# Patient Record
Sex: Female | Born: 1950 | Hispanic: No | Marital: Married | Smoking: Never smoker
Health system: Southern US, Community
[De-identification: ages and names within clinical notes are randomized; demographics above are authoritative.]

## PROBLEM LIST (undated history)

## (undated) DIAGNOSIS — M8580 Other specified disorders of bone density and structure, unspecified site: Secondary | ICD-10-CM

## (undated) DIAGNOSIS — R011 Cardiac murmur, unspecified: Secondary | ICD-10-CM

## (undated) DIAGNOSIS — Z5189 Encounter for other specified aftercare: Secondary | ICD-10-CM

## (undated) DIAGNOSIS — I10 Essential (primary) hypertension: Secondary | ICD-10-CM

## (undated) DIAGNOSIS — M87051 Idiopathic aseptic necrosis of right femur: Secondary | ICD-10-CM

## (undated) DIAGNOSIS — M51369 Other intervertebral disc degeneration, lumbar region without mention of lumbar back pain or lower extremity pain: Secondary | ICD-10-CM

## (undated) DIAGNOSIS — M719 Bursopathy, unspecified: Secondary | ICD-10-CM

## (undated) DIAGNOSIS — M4800 Spinal stenosis, site unspecified: Secondary | ICD-10-CM

## (undated) DIAGNOSIS — C801 Malignant (primary) neoplasm, unspecified: Secondary | ICD-10-CM

## (undated) DIAGNOSIS — K219 Gastro-esophageal reflux disease without esophagitis: Secondary | ICD-10-CM

## (undated) DIAGNOSIS — T8859XA Other complications of anesthesia, initial encounter: Secondary | ICD-10-CM

## (undated) DIAGNOSIS — E7800 Pure hypercholesterolemia, unspecified: Secondary | ICD-10-CM

## (undated) DIAGNOSIS — C8290 Follicular lymphoma, unspecified, unspecified site: Secondary | ICD-10-CM

## (undated) DIAGNOSIS — K5790 Diverticulosis of intestine, part unspecified, without perforation or abscess without bleeding: Secondary | ICD-10-CM

## (undated) DIAGNOSIS — K589 Irritable bowel syndrome without diarrhea: Secondary | ICD-10-CM

## (undated) DIAGNOSIS — K635 Polyp of colon: Secondary | ICD-10-CM

## (undated) DIAGNOSIS — C4491 Basal cell carcinoma of skin, unspecified: Secondary | ICD-10-CM

## (undated) HISTORY — PX: ABDOMINAL HYSTERECTOMY: SHX81

## (undated) HISTORY — PX: FL INJ RIGHT KNEE MR ARTHROGRAM (ARMC HX): HXRAD1302

## (undated) HISTORY — PX: SPINE SURGERY: SHX786

## (undated) HISTORY — PX: BREAST SURGERY: SHX581

## (undated) HISTORY — PX: TUBAL LIGATION: SHX77

## (undated) HISTORY — PX: LYMPH GLAND EXCISION: SHX13

## (undated) HISTORY — PX: BACK SURGERY: SHX140

## (undated) HISTORY — PX: KNEE ARTHROSCOPY: SHX127

## (undated) HISTORY — PX: OTHER SURGICAL HISTORY: SHX169

## (undated) HISTORY — PX: FIBULA FRACTURE SURGERY: SHX947

## (undated) HISTORY — PX: CARDIAC CATHETERIZATION: SHX172

## (undated) HISTORY — PX: CHOLECYSTECTOMY: SHX55

---

## 2008-02-23 ENCOUNTER — Ambulatory Visit: Payer: Self-pay | Admitting: Unknown Physician Specialty

## 2010-10-29 ENCOUNTER — Ambulatory Visit: Payer: Self-pay | Admitting: Rheumatology

## 2010-12-03 ENCOUNTER — Ambulatory Visit: Payer: Self-pay | Admitting: Anesthesiology

## 2010-12-11 ENCOUNTER — Ambulatory Visit: Payer: Self-pay | Admitting: General Practice

## 2011-06-28 ENCOUNTER — Other Ambulatory Visit: Payer: Self-pay | Admitting: Unknown Physician Specialty

## 2011-06-28 LAB — CLOSTRIDIUM DIFFICILE BY PCR

## 2011-07-22 ENCOUNTER — Ambulatory Visit: Payer: Self-pay | Admitting: Internal Medicine

## 2011-07-28 ENCOUNTER — Ambulatory Visit: Payer: Self-pay | Admitting: Unknown Physician Specialty

## 2011-08-02 LAB — PATHOLOGY REPORT

## 2011-12-23 DIAGNOSIS — Z853 Personal history of malignant neoplasm of breast: Secondary | ICD-10-CM | POA: Insufficient documentation

## 2012-06-12 DIAGNOSIS — R351 Nocturia: Secondary | ICD-10-CM | POA: Insufficient documentation

## 2012-06-12 DIAGNOSIS — R350 Frequency of micturition: Secondary | ICD-10-CM | POA: Insufficient documentation

## 2012-07-04 ENCOUNTER — Emergency Department: Payer: Self-pay | Admitting: Emergency Medicine

## 2013-06-03 IMAGING — CT CT ABD-PELV W/O CM
1 of 2 series · 15 of 32 positions shown, 19 images · non-contrast
Comparison: none

REASON FOR EXAM: hematuria renal stone protocol
COMMENTS:

PROCEDURE:     KCT - KCT ABDOMEN/PELVIS WO  - July 22, 2011  [DATE]
RESULT:
TECHNIQUE: Helical noncontrasted 5 mm sections were obtained from the lung
bases through the pubic symphysis.

[Series 2: abd wo 5.0 i40f 3 · axial · 0.94mm/px · z∈[-290,+150]mm · 15 of 96 slices shown, 19 images]
[im 4/96  soft-tissue]
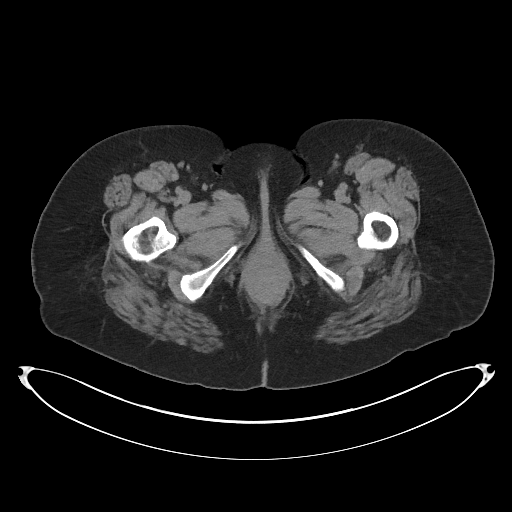
[im 4/96  bone]
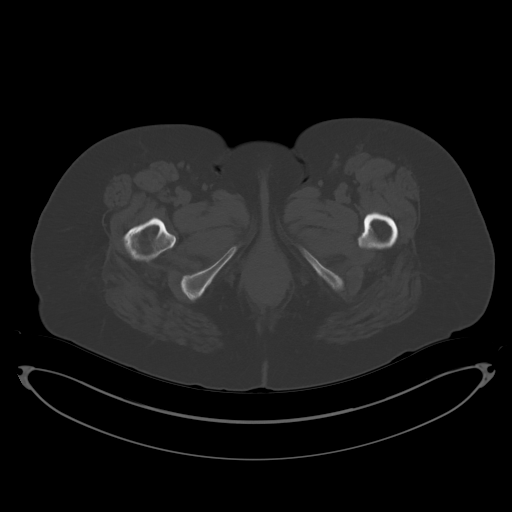
[im 12/96  soft-tissue]
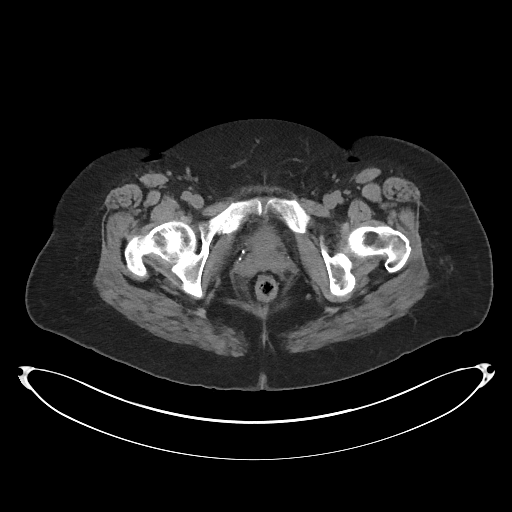
[im 20/96  soft-tissue]
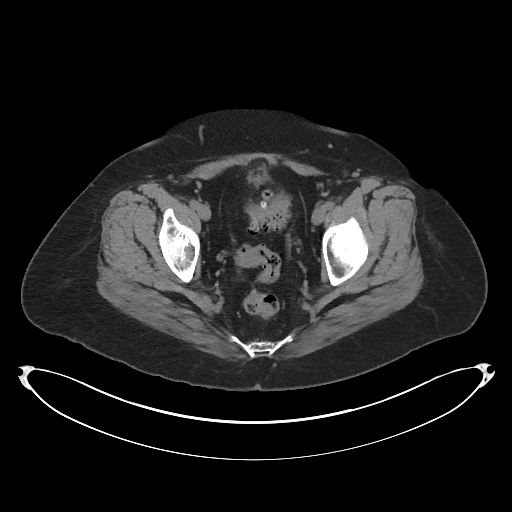
[im 27/96  soft-tissue]
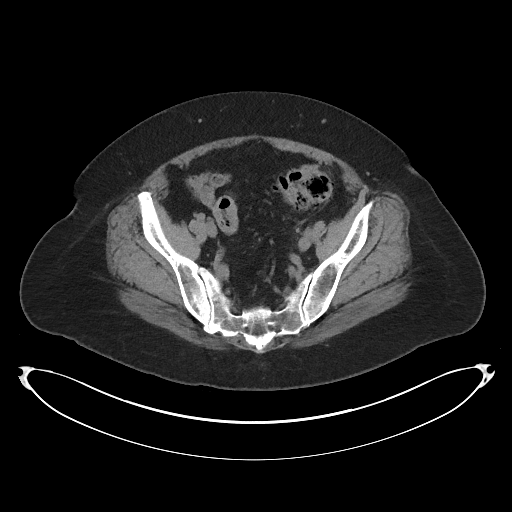
[im 35/96  soft-tissue]
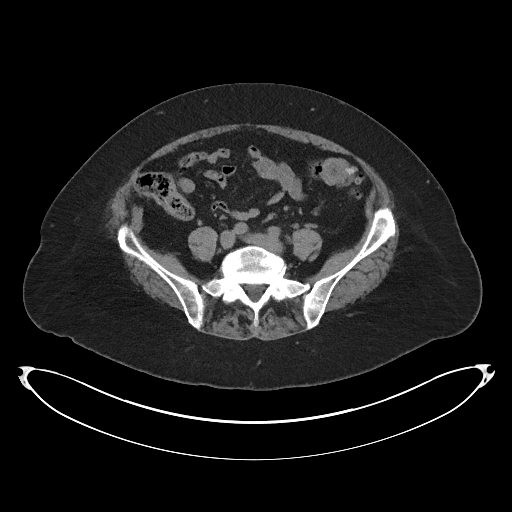
[im 42/96  soft-tissue]
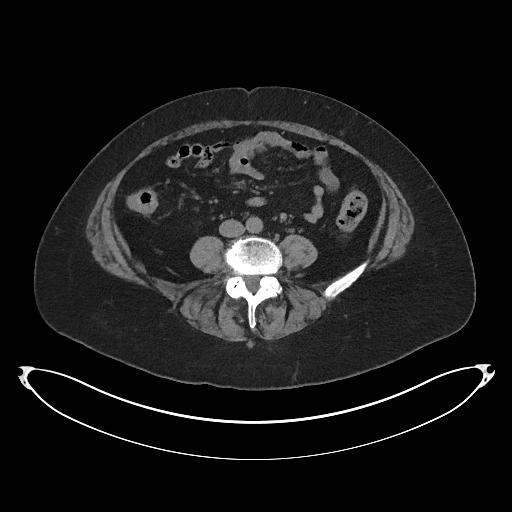
[im 50/96  soft-tissue]
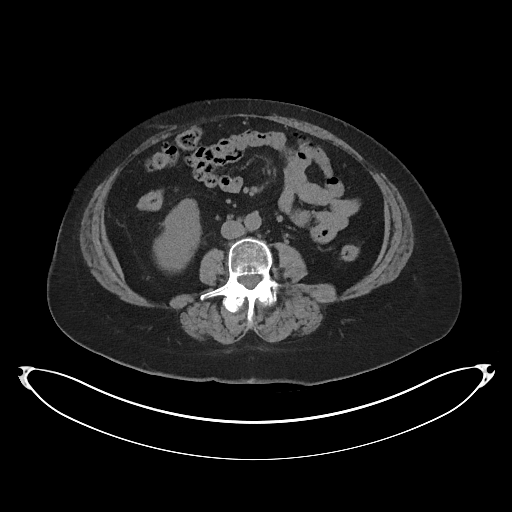
[im 54/96  soft-tissue]
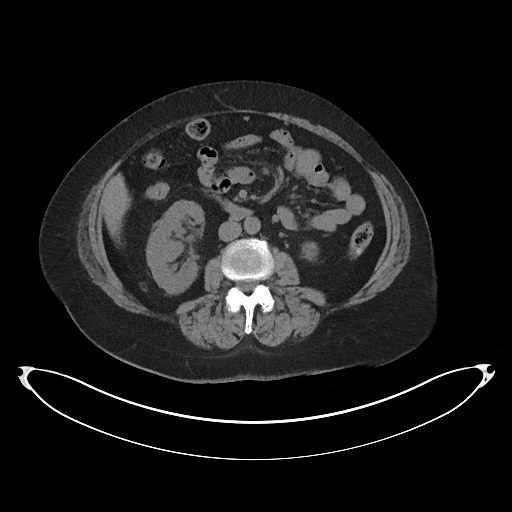
[im 61/96  soft-tissue]
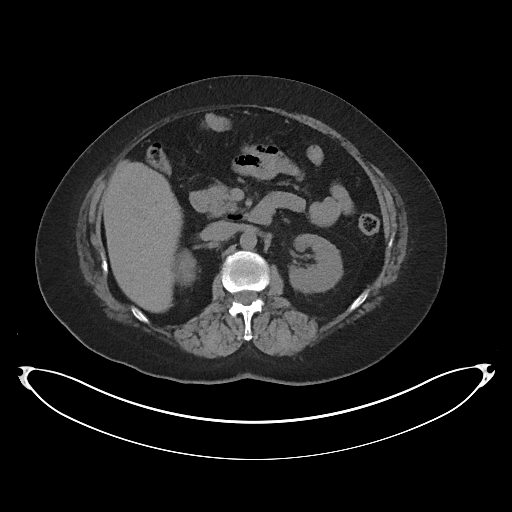
[im 61/96  bone]
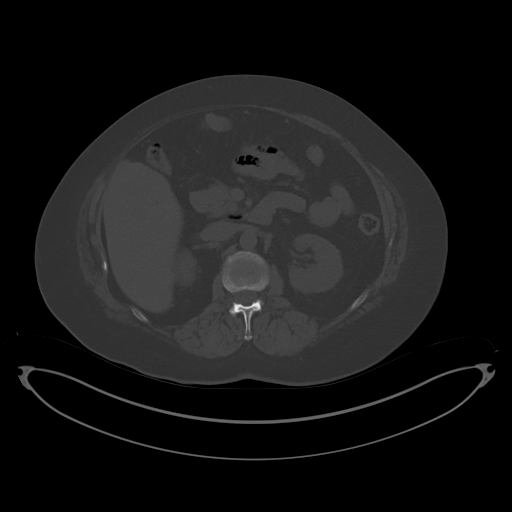
[im 69/96  soft-tissue]
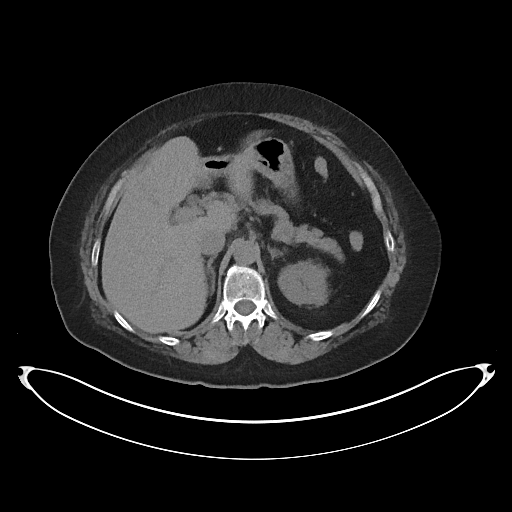
[im 77/96  soft-tissue]
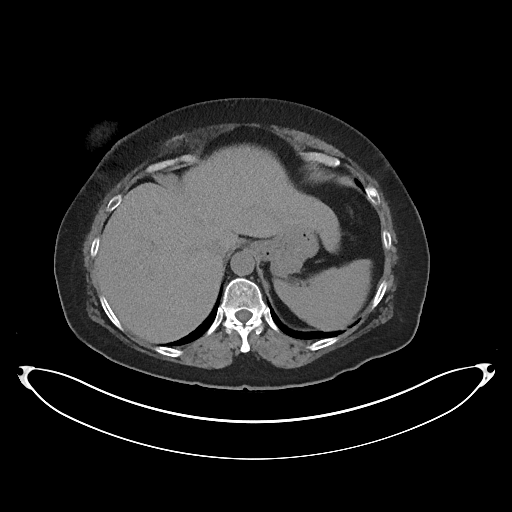
[im 80/96  lung]
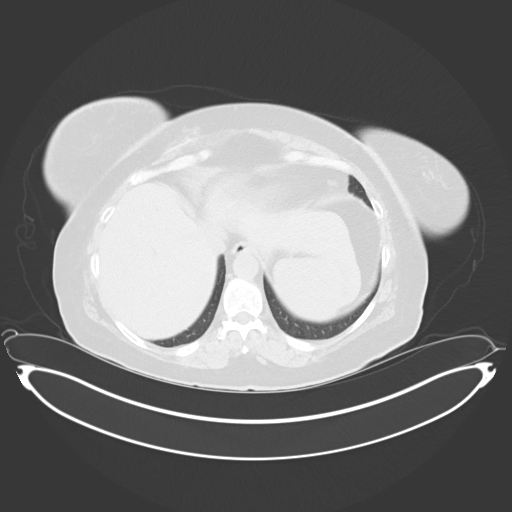
[im 84/96  soft-tissue]
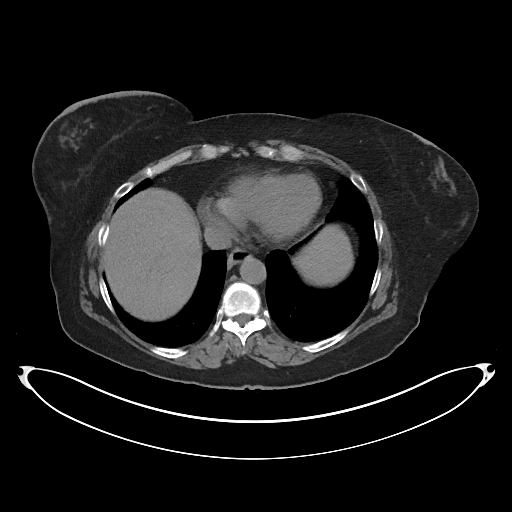
[im 84/96  lung]
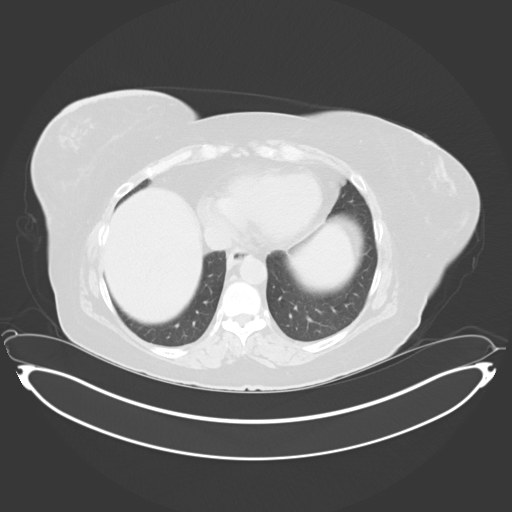
[im 88/96  lung]
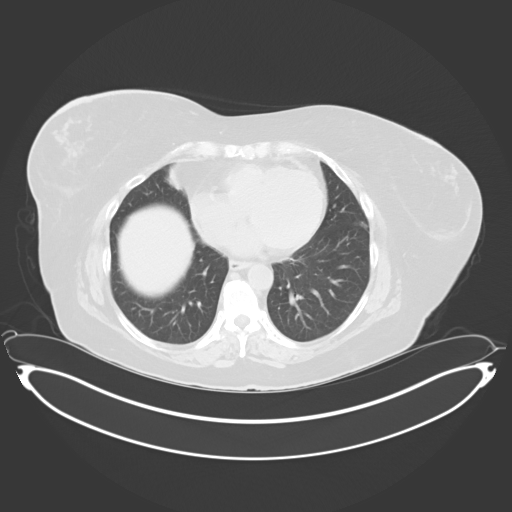
[im 92/96  soft-tissue]
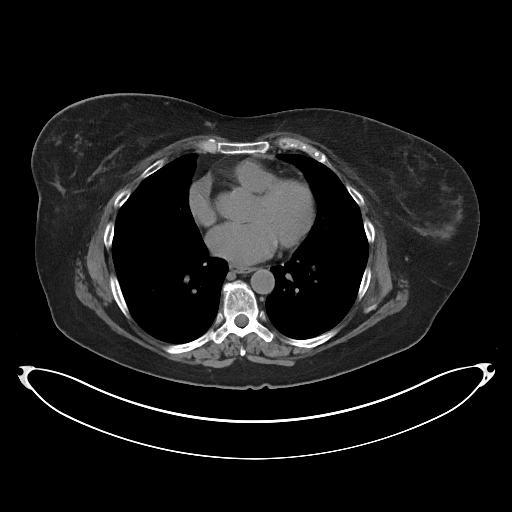
[im 92/96  lung]
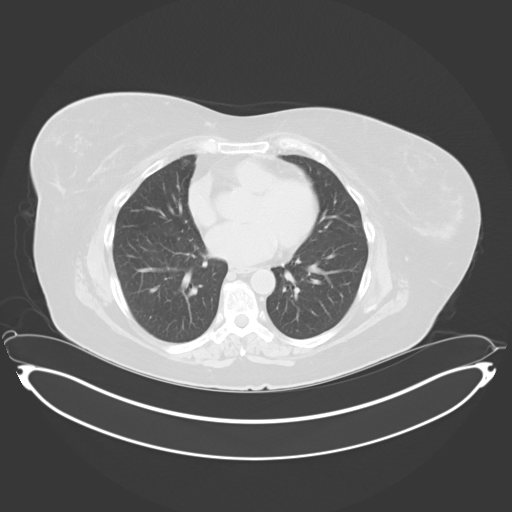

[15 of 32 positions shown; findings below may reference images not displayed]

FINDINGS: The lung bases are unremarkable.

Noncontrast evaluation of the liver, spleen, adrenals, and pancreas is
unremarkable. There is no CT evidence of hydronephrosis, hydroureter,
nephrolithiasis nor ureterolithiasis. There is no CT evidence of abdominal
or pelvic masses, free fluid, loculated fluid collections nor adenopathy
within the limitations of a noncontrasted CT. There is diverticulosis within
the sigmoid colon. There is no evidence of an abdominal aortic aneurysm.
There is no CT evidence of bowel obstruction, enteritis, colitis, nor
diverticulitis within the limitations of a noncontrasted CT.
IMPRESSION: No CT evidence of obstructive or inflammatory abnormalities.

## 2013-11-21 DIAGNOSIS — E559 Vitamin D deficiency, unspecified: Secondary | ICD-10-CM | POA: Insufficient documentation

## 2014-02-28 ENCOUNTER — Ambulatory Visit: Payer: Self-pay | Admitting: Rheumatology

## 2014-04-04 DIAGNOSIS — M5416 Radiculopathy, lumbar region: Secondary | ICD-10-CM | POA: Insufficient documentation

## 2014-04-04 DIAGNOSIS — M48062 Spinal stenosis, lumbar region with neurogenic claudication: Secondary | ICD-10-CM | POA: Insufficient documentation

## 2014-05-09 DIAGNOSIS — M7062 Trochanteric bursitis, left hip: Secondary | ICD-10-CM | POA: Insufficient documentation

## 2015-11-04 ENCOUNTER — Encounter: Payer: Self-pay | Admitting: Emergency Medicine

## 2015-11-04 ENCOUNTER — Emergency Department
Admission: EM | Admit: 2015-11-04 | Discharge: 2015-11-04 | Disposition: A | Discharge status: Other Acute Inpatient Hospital | Payer: BLUE CROSS/BLUE SHIELD | Attending: Emergency Medicine | Admitting: Emergency Medicine

## 2015-11-04 DIAGNOSIS — E876 Hypokalemia: Secondary | ICD-10-CM

## 2015-11-04 DIAGNOSIS — E871 Hypo-osmolality and hyponatremia: Secondary | ICD-10-CM

## 2015-11-04 DIAGNOSIS — I10 Essential (primary) hypertension: Secondary | ICD-10-CM | POA: Insufficient documentation

## 2015-11-04 DIAGNOSIS — K625 Hemorrhage of anus and rectum: Secondary | ICD-10-CM | POA: Diagnosis present

## 2015-11-04 DIAGNOSIS — K922 Gastrointestinal hemorrhage, unspecified: Secondary | ICD-10-CM

## 2015-11-04 HISTORY — DX: Bursopathy, unspecified: M71.9

## 2015-11-04 HISTORY — DX: Pure hypercholesterolemia, unspecified: E78.00

## 2015-11-04 HISTORY — DX: Essential (primary) hypertension: I10

## 2015-11-04 HISTORY — DX: Irritable bowel syndrome, unspecified: K58.9

## 2015-11-04 HISTORY — DX: Spinal stenosis, site unspecified: M48.00

## 2015-11-04 LAB — COMPREHENSIVE METABOLIC PANEL
ALT: 22 U/L (ref 14–54)
AST: 77 U/L — AB (ref 15–41)
Albumin: 4.6 g/dL (ref 3.5–5.0)
Alkaline Phosphatase: 61 U/L (ref 38–126)
Anion gap: 9 (ref 5–15)
BUN: 13 mg/dL (ref 6–20)
CHLORIDE: 89 mmol/L — AB (ref 101–111)
CO2: 32 mmol/L (ref 22–32)
CREATININE: 0.78 mg/dL (ref 0.44–1.00)
Calcium: 9.8 mg/dL (ref 8.9–10.3)
GFR calc non Af Amer: >60 mL/min (ref >60)
Glucose, Bld: 112 mg/dL — ABNORMAL HIGH (ref 65–99)
POTASSIUM: 2.6 mmol/L — AB (ref 3.5–5.1)
SODIUM: 130 mmol/L — AB (ref 135–145)
Total Bilirubin: 0.7 mg/dL (ref 0.3–1.2)
Total Protein: 7.5 g/dL (ref 6.5–8.1)

## 2015-11-04 LAB — CBC
HEMATOCRIT: 34.9 % — AB (ref 35.0–47.0)
Hemoglobin: 12.5 g/dL (ref 12.0–16.0)
MCH: 31 pg (ref 26.0–34.0)
MCHC: 35.9 g/dL (ref 32.0–36.0)
MCV: 86.3 fL (ref 80.0–100.0)
PLATELETS: 295 10*3/uL (ref 150–440)
RBC: 4.05 MIL/uL (ref 3.80–5.20)
RDW: 12.6 % (ref 11.5–14.5)
WBC: 10.8 10*3/uL (ref 3.6–11.0)

## 2015-11-04 LAB — TYPE AND SCREEN
ABO/RH(D): O POS
Antibody Screen: NEGATIVE

## 2015-11-04 MED ORDER — SODIUM CHLORIDE 0.9 % IV BOLUS (SEPSIS)
1000.0000 mL | Freq: Once | INTRAVENOUS | Status: AC
Start: 2015-11-04 — End: 2015-11-04
  Administered 2015-11-04: 1000 mL via INTRAVENOUS

## 2015-11-04 MED ORDER — POTASSIUM CHLORIDE 10 MEQ/100ML IV SOLN
10.0000 meq | INTRAVENOUS | Status: AC
  Administered 2015-11-04 (×2): 10 meq via INTRAVENOUS
  Filled 2015-11-04 (×2): qty 100

## 2015-11-04 NOTE — ED Provider Notes (Signed)
[facility] Emergency Department Provider Note   ____________________________________________  Time seen: Approximately 2:28 AM  I have reviewed the triage vital signs and the nursing notes.   HISTORY  Chief Complaint Rectal Bleeding    HPI Veronica Cisneros is a 65 y.o. female who comes into the hospital today with rectal bleeding. The patient had a colonoscopy done this afternoon and reports now that she's having bright red blood from her rectum. She reports that the colonoscopy was around 1:30 this afternoon. She took a nap and then had dinner. She started having some bleeding around 11 PM. She's had 6-7 episodes of bright blood. She had a tiny polyp removed today during her colonoscopy. The procedure was done at [facility]. She reports that she was told if she had more than 2 tablespoons of blood to going to the nearest emergency department. The patient has some mild left lower quadrant tenderness and she says she feels exhausted but has no significant lightheadedness or dizziness. She reports that she did have some wine this evening but nothing else. The patient is here for evaluation.The patient rates her abdominal tenderness a 2 out of 10 in intensity.   Past Medical History  Diagnosis Date  . Bursitis   . Hypertension   . Spinal stenosis   . IBS (irritable bowel syndrome)   . High cholesterol     There are no active problems to display for this patient.   Past Surgical History  Procedure Laterality Date  . Colonscopy    . Abdominal hysterectomy    . Spine surgery    . Breast surgery    . Fl inj right knee mr arthrogram ([facility] hx)      No current outpatient prescriptions on file.  Allergies Valium  No family history on file.  Social History Social History  Substance Use Topics  . Smoking status: Never Smoker   . Smokeless tobacco: Never Used  . Alcohol Use: Yes    Review of Systems Constitutional: No fever/chills Eyes: No visual  changes. ENT: No sore throat. Cardiovascular: Denies chest pain. Respiratory: Denies shortness of breath. Gastrointestinal: Mild abdominal pain and rectal bleeding Genitourinary: Negative for dysuria. Musculoskeletal: Negative for back pain. Skin: Negative for rash. Neurological: Negative for headaches, focal weakness or numbness.  10-point ROS otherwise negative.  ____________________________________________   PHYSICAL EXAM:  VITAL SIGNS: ED Triage Vitals  Enc Vitals Group     BP 11/04/15 0046 192/86 mmHg     Pulse Rate 11/04/15 0046 112     Resp 11/04/15 0046 16     Temp 11/04/15 0046 98.4 F (36.9 C)     Temp src --      SpO2 11/04/15 0046 100 %     Weight 11/04/15 0046 177 lb (80.287 kg)     Height 11/04/15 0046 5\' 4"  (1.626 m)     Head Cir --      Peak Flow --      Pain Score 11/04/15 0046 2     Pain Loc --      Pain Edu? --      Excl. in [place]? --     Constitutional: Alert and oriented. Well appearing and in Mild distress. Eyes: Conjunctivae are normal. PERRL. EOMI. Head: Atraumatic. Nose: No congestion/rhinnorhea. Mouth/Throat: Mucous membranes are moist.  Oropharynx non-erythematous. Cardiovascular: Normal rate, regular rhythm. Grossly normal heart sounds.  Good peripheral circulation. Respiratory: Normal respiratory effort.  No retractions. Lungs CTAB. Gastrointestinal: Soft with mild left lower  quadrant tenderness to palpation. No distention. Positive bowel sounds Musculoskeletal: No lower extremity tenderness nor edema.   Neurologic:  Normal speech and language.  Skin:  Skin is warm, dry and intact.  Psychiatric: Mood and affect are normal.   ____________________________________________   LABS (all labs ordered are listed, but only abnormal results are displayed)  Labs Reviewed  COMPREHENSIVE METABOLIC PANEL - Abnormal; Notable for the following:    Sodium 130 (*)    Potassium 2.6 (*)    Chloride 89 (*)    Glucose, Bld 112 (*)    AST 77 (*)    All  other components within normal limits  CBC - Abnormal; Notable for the following:    HCT 34.9 (*)    All other components within normal limits  POC OCCULT BLOOD, ED  TYPE AND SCREEN   ____________________________________________  EKG  None ____________________________________________  RADIOLOGY  None ____________________________________________   PROCEDURES  Procedure(s) performed: None  Procedures  Critical Care performed: No  ____________________________________________   INITIAL IMPRESSION / ASSESSMENT AND PLAN / ED COURSE  Pertinent labs & imaging results that were available during my care of the patient were reviewed by me and considered in my medical decision making (see chart for details).  This is a 65 year old female who comes into the hospital today with bright red bleeding per rectum after having a colonoscopy done at [facility]. The patient's blood work at this time show some hyponatremia and hypokalemia but her H&H is stable. I will contact [facility] to have the patient transferred back as her care was done there and initially. The patient receive a liter of normal saline as well as 2 doses of KCl.  I discussed the case with Dr. Valetta Mole from [facility] and the patient was accepted over to [facility]. ____________________________________________   FINAL CLINICAL IMPRESSION(S) / ED DIAGNOSES  Final diagnoses:  Lower GI bleeding  Hyponatremia  Hypokalemia      NEW MEDICATIONS STARTED DURING THIS VISIT:  New Prescriptions   No medications on file     Note:  This document was prepared using Dragon voice recognition software and may include unintentional dictation errors.    Loney Hering, MD 11/04/15 [phone]

## 2015-11-04 NOTE — ED Notes (Signed)
Transferred to [place].

## 2015-11-04 NOTE — ED Notes (Signed)
EDP notified of critical potassium.  

## 2015-11-04 NOTE — ED Notes (Addendum)
Pt states had colonscopy this afternoon. Pt states has rectal bleeding, bright red,comparitively/ approx one half tampon saturation that began around 2330. Pt denies dizziness, lightheadedness. Skin normal color warm and dry. Pt denies nausea or fever.

## 2015-11-14 DIAGNOSIS — E538 Deficiency of other specified B group vitamins: Secondary | ICD-10-CM | POA: Insufficient documentation

## 2015-11-14 DIAGNOSIS — M47816 Spondylosis without myelopathy or radiculopathy, lumbar region: Secondary | ICD-10-CM | POA: Insufficient documentation

## 2015-11-14 DIAGNOSIS — K633 Ulcer of intestine: Secondary | ICD-10-CM | POA: Insufficient documentation

## 2016-02-17 ENCOUNTER — Ambulatory Visit (INDEPENDENT_AMBULATORY_CARE_PROVIDER_SITE_OTHER): Payer: BLUE CROSS/BLUE SHIELD | Admitting: Vascular Surgery

## 2016-02-17 ENCOUNTER — Encounter (INDEPENDENT_AMBULATORY_CARE_PROVIDER_SITE_OTHER): Payer: Self-pay | Admitting: Vascular Surgery

## 2016-02-17 ENCOUNTER — Encounter (INDEPENDENT_AMBULATORY_CARE_PROVIDER_SITE_OTHER): Payer: Self-pay

## 2016-02-17 VITALS — BP 146/85 | HR 92 | Resp 17 | Ht 64.0 in | Wt 188.0 lb

## 2016-02-17 DIAGNOSIS — M79604 Pain in right leg: Secondary | ICD-10-CM

## 2016-02-17 DIAGNOSIS — M7989 Other specified soft tissue disorders: Secondary | ICD-10-CM

## 2016-02-17 DIAGNOSIS — E785 Hyperlipidemia, unspecified: Secondary | ICD-10-CM

## 2016-02-17 DIAGNOSIS — I10 Essential (primary) hypertension: Secondary | ICD-10-CM

## 2016-02-17 DIAGNOSIS — M79605 Pain in left leg: Secondary | ICD-10-CM

## 2016-02-18 DIAGNOSIS — E785 Hyperlipidemia, unspecified: Secondary | ICD-10-CM | POA: Insufficient documentation

## 2016-02-18 DIAGNOSIS — M79609 Pain in unspecified limb: Secondary | ICD-10-CM | POA: Insufficient documentation

## 2016-02-18 DIAGNOSIS — M7989 Other specified soft tissue disorders: Secondary | ICD-10-CM | POA: Insufficient documentation

## 2016-02-18 DIAGNOSIS — I10 Essential (primary) hypertension: Secondary | ICD-10-CM | POA: Insufficient documentation

## 2016-02-18 NOTE — Assessment & Plan Note (Signed)
The patient has lower extremity symptoms that sound worrisome for peripheral arterial disease. The can certainly be related to her spinal stenosis and not arterial insufficiency, but I would recommend noninvasive studies for further evaluation. We discussed the differences between peripheral arterial disease and neurogenic claudication. We will see the patient back following her studies to discuss the results and determine further treatment options.

## 2016-02-18 NOTE — Assessment & Plan Note (Signed)
Has had intermittent episodes of swelling, particularly associated with severe medical illnesses. Her swelling is currently reasonably well controlled with compression stockings. I think it is reasonable to assess this further with a venous duplex for further evaluation. The pathophysiology and natural history of venous disease were discussed.

## 2016-02-18 NOTE — Progress Notes (Signed)
Patient ID: Veronica Cisneros, female   DOB: [birth year], 65 y.o.   MRN: [id number]  Chief Complaint  Patient presents with  . New Patient (Initial Visit)    HPI Veronica Cisneros is a 65 y.o. female.  I am asked to see the patient by Dr. Caryl Comes for evaluation of leg pain and swelling.  The patient reports a history of spinal stenosis, but she has been having increasing pain that radiates down her thighs into her calves. This has been present for many months and has been steadily worsening. There was no clear inciting event or causative factor that started the pain. She also has had intermittent significant swelling. When she was hospitalized with a bleeding episode earlier this year, her swelling became quite prominent. It is reasonably mild today that she wears support or compression stockings and elevates her legs frequently. She has not had ulceration or infection. She denies fever or chills. Both lower extremities are affected and they are about the same.   Past Medical History:  Diagnosis Date  . Bursitis   . High cholesterol   . Hypertension   . IBS (irritable bowel syndrome)   . Spinal stenosis     Past Surgical History:  Procedure Laterality Date  . ABDOMINAL HYSTERECTOMY    . BREAST SURGERY    . colonscopy    . FL INJ RIGHT KNEE MR ARTHROGRAM ([facility] HX)    . SPINE SURGERY      Family History  Problem Relation Age of Onset  . Hypertension Mother   bleeding or clotting issues to their knowledge, no aneurysms  Social History Social History  Substance Use Topics  . Smoking status: Never Smoker  . Smokeless tobacco: Never Used  . Alcohol use Yes  No IVDU  Allergies  Allergen Reactions  . Valium [Diazepam] Other (See Comments)  . Cephalexin Nausea Only  . Ciprofloxacin Diarrhea and Nausea Only    Current Outpatient Prescriptions  Medication Sig Dispense Refill  . acetaminophen (TYLENOL) 650 MG CR tablet Take by mouth.    Marland Kitchen ascorbic acid (VITAMIN C) 100 MG tablet  Take 100 mg by mouth daily.    Marland Kitchen aspirin EC 81 MG tablet Take 81 mg by mouth.    Marland Kitchen atenolol (TENORMIN) 25 MG tablet   2  . Cholecalciferol (VITAMIN D3) 1000 units CAPS Take by mouth.    . Cholecalciferol 1000 units CHEW Chew by mouth.    . folic acid (FOLVITE) 1 MG tablet Take 1 mg by mouth.    . gabapentin (NEURONTIN) 300 MG capsule 1 po bid    . meloxicam (MOBIC) 15 MG tablet Take 15 mg by mouth.    . nortriptyline (PAMELOR) 25 MG capsule Take by mouth.    . nystatin cream (MYCOSTATIN) Apply topically.    . nystatin cream (MYCOSTATIN)   4  . pantoprazole (PROTONIX) 40 MG tablet Take by mouth.    . RA KRILL OIL 500 MG CAPS Take by mouth.    . rosuvastatin (CRESTOR) 5 MG tablet TAKE ONE TABLET AT BEDTIME    . Salicylic Acid 6 % CREA Apply 1 Application topically 2 (two) times daily as needed.    Marland Kitchen telmisartan-hydrochlorothiazide (MICARDIS HCT) 40-12.5 MG tablet   2   No current facility-administered medications for this visit.       REVIEW OF SYSTEMS (Negative unless checked)  Constitutional: [] Weight loss  [] Fever  [] Chills Cardiac: [] Chest pain   [] Chest pressure   [] Palpitations   []   Shortness of breath when laying flat   [] Shortness of breath at rest   [] Shortness of breath with exertion. Vascular:  [x] Pain in legs with walking   [] Pain in legs at rest   [] Pain in legs when laying flat   [] Claudication   [] Pain in feet when walking  [] Pain in feet at rest  [] Pain in feet when laying flat   [] History of DVT   [] Phlebitis   [x] Swelling in legs   [] Varicose veins   [] Non-healing ulcers Pulmonary:   [] Uses home oxygen   [] Productive cough   [] Hemoptysis   [] Wheeze  [] COPD   [] Asthma Neurologic:  [] Dizziness  [] Blackouts   [] Seizures   [] History of stroke   [] History of TIA  [] Aphasia   [] Temporary blindness   [] Dysphagia   [] Weakness or numbness in arms   [] Weakness or numbness in legs Musculoskeletal:  [] Arthritis   [] Joint swelling   [] Joint pain   [] Low back pain Hematologic:  [] Easy  bruising  [] Easy bleeding   [] Hypercoagulable state   [] Anemic  [] Hepatitis Gastrointestinal:  [] Blood in stool   [] Vomiting blood  [] Gastroesophageal reflux/heartburn   [] Abdominal pain Genitourinary:  [] Chronic kidney disease   [] Difficult urination  [] Frequent urination  [] Burning with urination   [] Hematuria Skin:  [] Rashes   [] Ulcers   [] Wounds Psychological:  [] History of anxiety   []  History of major depression.    Physical Exam BP (!) 146/85   Pulse 92   Resp 17   Ht 5\' 4"  (1.626 m)   Wt 85.3 kg (188 lb)   BMI 32.27 kg/m  Gen:  WD/WN, NAD Head: [place]/AT, No temporalis wasting.  Ear/Nose/Throat: Hearing grossly intact, nares w/o erythema or drainage, oropharynx w/o Erythema/Exudate Eyes: sclera non-icteric, conjunctiva clear Neck: Supple, no nuchal rigidity.  No JVD.  Pulmonary:  Good air movement, no use of accessory muscles Cardiac: RRR, normal S1, S2 Vascular:  Vessel Right Left  Radial Palpable Palpable  Ulnar Palpable Palpable  Brachial Palpable Palpable  Carotid Palpable, without bruit Palpable, without bruit  Aorta Not palpable N/A  Femoral Palpable Palpable  Popliteal Palpable Palpable  PT 1+, Palpable Trace Palpable  DP Palpable Palpable   Gastrointestinal: soft, non-tender/non-distended. No guarding/reflex. No masses, surgical incisions, or scars. Musculoskeletal: M/S 5/5 throughout.  Extremities without ischemic changes.  No deformity or atrophy. Trace to 1+ BLE edema.  Neurologic:  Pain and light touch intact in extremities.  Symmetrical.  Speech is fluent. Motor exam as listed above. Psychiatric: Judgment intact, Mood & affect appropriate for pt's clinical situation. Dermatologic: No rashes or ulcers noted.  No cellulitis or open wounds. Lymph : No Cervical, Axillary, or Inguinal lymphadenopathy.   Radiology No results found.  Labs No results found for this or any previous visit (from the past 2160 hour(s)).  Assessment/Plan:  Essential  hypertension blood pressure control important in reducing the progression of atherosclerotic disease. On appropriate oral medications.   Hyperlipidemia lipid control important in reducing the progression of atherosclerotic disease. Continue statin therapy       Leotis Pain 02/18/2016, 11:16 AM   This note was created with Dragon medical transcription system.  Any errors from dictation are unintentional.

## 2016-02-18 NOTE — Patient Instructions (Signed)
Peripheral Vascular Disease Peripheral vascular disease (PVD) is a disease of the blood vessels that are not part of your heart and brain. A simple term for PVD is poor circulation. In most cases, PVD narrows the blood vessels that carry blood from your heart to the rest of your body. This can result in a decreased supply of blood to your arms, legs, and internal organs, like your stomach or kidneys. However, it most often affects a person's lower legs and feet. There are two types of PVD.  Organic PVD. This is the more common type. It is caused by damage to the structure of blood vessels.  Functional PVD. This is caused by conditions that make blood vessels contract and tighten (spasm). Without treatment, PVD tends to get worse over time. PVD can also lead to acute ischemic limb. This is when an arm or limb suddenly has trouble getting enough blood. This is a medical emergency. CAUSES Each type of PVD has many different causes. The most common cause of PVD is buildup of a fatty material (plaque) inside of your arteries (atherosclerosis). Small amounts of plaque can break off from the walls of the blood vessels and become lodged in a smaller artery. This blocks blood flow and can cause acute ischemic limb. Other common causes of PVD include:  Blood clots that form inside of blood vessels.  Injuries to blood vessels.  Diseases that cause inflammation of blood vessels or cause blood vessel spasms.  Health behaviors and health history that increase your risk of developing PVD. RISK FACTORS  You may have a greater risk of PVD if you:  Have a family history of PVD.  Have certain medical conditions, including:  High cholesterol.  Diabetes.  High blood pressure (hypertension).  Coronary heart disease.  Past problems with blood clots.  Past injury, such as burns or a broken bone. These may have damaged blood vessels in your limbs.  Buerger disease. This is caused by inflamed blood  vessels in your hands and feet.  Some forms of arthritis.  Rare birth defects that affect the arteries in your legs.  Use tobacco.  Do not get enough exercise.  Are obese.  Are age 50 or older. SIGNS AND SYMPTOMS  PVD may cause many different symptoms. Your symptoms depend on what part of your body is not getting enough blood. Some common signs and symptoms include:  Cramps in your lower legs. This may be a symptom of poor leg circulation (claudication).  Pain and weakness in your legs while you are physically active that goes away when you rest (intermittent claudication).  Leg pain when at rest.  Leg numbness, tingling, or weakness.  Coldness in a leg or foot, especially when compared with the other leg.  Skin or hair changes. These can include:  Hair loss.  Shiny skin.  Pale or bluish skin.  Thick toenails.  Inability to get or maintain an erection (erectile dysfunction). People with PVD are more prone to developing ulcers and sores on their toes, feet, or legs. These may take longer than normal to heal. DIAGNOSIS Your health care provider may diagnose PVD from your signs and symptoms. The health care provider will also do a physical exam. You may have tests to find out what is causing your PVD and determine its severity. Tests may include:  Blood pressure recordings from your arms and legs and measurements of the strength of your pulses (pulse volume recordings).  Imaging studies using sound waves to take pictures of   the blood flow through your blood vessels (Doppler ultrasound).  Injecting a dye into your blood vessels before having imaging studies using:  X-rays (angiogram or arteriogram).  Computer-generated X-rays (CT angiogram).  A powerful electromagnetic field and a computer (magnetic resonance angiogram or MRA). TREATMENT Treatment for PVD depends on the cause of your condition and the severity of your symptoms. It also depends on your age. Underlying  causes need to be treated and controlled. These include long-lasting (chronic) conditions, such as diabetes, high cholesterol, and high blood pressure. You may need to first try making lifestyle changes and taking medicines. Surgery may be needed if these do not work. Lifestyle changes may include:  Quitting smoking.  Exercising regularly.  Following a low-fat, low-cholesterol diet. Medicines may include:  Blood thinners to prevent blood clots.  Medicines to improve blood flow.  Medicines to improve your blood cholesterol levels. Surgical procedures may include:  A procedure that uses an inflated balloon to open a blocked artery and improve blood flow (angioplasty).  A procedure to put in a tube (stent) to keep a blocked artery open (stent implant).  Surgery to reroute blood flow around a blocked artery (peripheral bypass surgery).  Surgery to remove dead tissue from an infected wound on the affected limb.  Amputation. This is surgical removal of the affected limb. This may be necessary in cases of acute ischemic limb that are not improved through medical or surgical treatments. HOME CARE INSTRUCTIONS  Take medicines only as directed by your health care provider.  Do not use any tobacco products, including cigarettes, chewing tobacco, or electronic cigarettes. If you need help quitting, ask your health care provider.  Lose weight if you are overweight, and maintain a healthy weight as directed by your health care provider.  Eat a diet that is low in fat and cholesterol. If you need help, ask your health care provider.  Exercise regularly. Ask your health care provider to suggest some good activities for you.  Use compression stockings or other mechanical devices as directed by your health care provider.  Take good care of your feet.  Wear comfortable shoes that fit well.  Check your feet often for any cuts or sores. SEEK MEDICAL CARE IF:  You have cramps in your legs  while walking.  You have leg pain when you are at rest.  You have coldness in a leg or foot.  Your skin changes.  You have erectile dysfunction.  You have cuts or sores on your feet that are not healing. SEEK IMMEDIATE MEDICAL CARE IF:  Your arm or leg turns cold and blue.  Your arms or legs become red, warm, swollen, painful, or numb.  You have chest pain or trouble breathing.  You suddenly have weakness in your face, arm, or leg.  You become very confused or lose the ability to speak.  You suddenly have a very bad headache or lose your vision.   This information is not intended to replace advice given to you by your health care provider. Make sure you discuss any questions you have with your health care provider.   Document Released: 05/27/2004 Document Revised: 05/10/2014 Document Reviewed: 09/27/2013 Elsevier Interactive Patient Education 2016 [employer]  

## 2016-02-18 NOTE — Assessment & Plan Note (Signed)
blood pressure control important in reducing the progression of atherosclerotic disease. On appropriate oral medications.  

## 2016-02-18 NOTE — Assessment & Plan Note (Signed)
lipid control important in reducing the progression of atherosclerotic disease. Continue statin therapy  

## 2016-03-22 ENCOUNTER — Encounter (INDEPENDENT_AMBULATORY_CARE_PROVIDER_SITE_OTHER): Payer: Medicare Other

## 2016-03-22 ENCOUNTER — Ambulatory Visit (INDEPENDENT_AMBULATORY_CARE_PROVIDER_SITE_OTHER): Payer: Medicare Other | Admitting: Vascular Surgery

## 2016-04-11 ENCOUNTER — Inpatient Hospital Stay
Admission: EM | Admit: 2016-04-11 | Discharge: 2016-04-15 | DRG: 872 | Disposition: A | Discharge status: Home / Self Care | Payer: BLUE CROSS/BLUE SHIELD | Attending: Internal Medicine | Admitting: Internal Medicine

## 2016-04-11 ENCOUNTER — Emergency Department: Payer: BLUE CROSS/BLUE SHIELD

## 2016-04-11 DIAGNOSIS — E7800 Pure hypercholesterolemia, unspecified: Secondary | ICD-10-CM | POA: Diagnosis present

## 2016-04-11 DIAGNOSIS — Z79899 Other long term (current) drug therapy: Secondary | ICD-10-CM | POA: Diagnosis not present

## 2016-04-11 DIAGNOSIS — L03313 Cellulitis of chest wall: Secondary | ICD-10-CM | POA: Diagnosis present

## 2016-04-11 DIAGNOSIS — M4800 Spinal stenosis, site unspecified: Secondary | ICD-10-CM | POA: Diagnosis present

## 2016-04-11 DIAGNOSIS — B372 Candidiasis of skin and nail: Secondary | ICD-10-CM | POA: Diagnosis present

## 2016-04-11 DIAGNOSIS — N61 Mastitis without abscess: Secondary | ICD-10-CM | POA: Diagnosis present

## 2016-04-11 DIAGNOSIS — E876 Hypokalemia: Secondary | ICD-10-CM | POA: Diagnosis present

## 2016-04-11 DIAGNOSIS — I10 Essential (primary) hypertension: Secondary | ICD-10-CM | POA: Diagnosis present

## 2016-04-11 DIAGNOSIS — K589 Irritable bowel syndrome without diarrhea: Secondary | ICD-10-CM | POA: Diagnosis present

## 2016-04-11 DIAGNOSIS — Z853 Personal history of malignant neoplasm of breast: Secondary | ICD-10-CM | POA: Diagnosis not present

## 2016-04-11 DIAGNOSIS — T380X5A Adverse effect of glucocorticoids and synthetic analogues, initial encounter: Secondary | ICD-10-CM | POA: Diagnosis not present

## 2016-04-11 DIAGNOSIS — L0390 Cellulitis, unspecified: Secondary | ICD-10-CM | POA: Diagnosis present

## 2016-04-11 DIAGNOSIS — Z923 Personal history of irradiation: Secondary | ICD-10-CM | POA: Diagnosis not present

## 2016-04-11 DIAGNOSIS — Z7982 Long term (current) use of aspirin: Secondary | ICD-10-CM | POA: Diagnosis not present

## 2016-04-11 DIAGNOSIS — R410 Disorientation, unspecified: Secondary | ICD-10-CM | POA: Diagnosis not present

## 2016-04-11 DIAGNOSIS — Z170 Estrogen receptor positive status [ER+]: Secondary | ICD-10-CM | POA: Diagnosis not present

## 2016-04-11 DIAGNOSIS — Z9221 Personal history of antineoplastic chemotherapy: Secondary | ICD-10-CM | POA: Diagnosis not present

## 2016-04-11 DIAGNOSIS — A419 Sepsis, unspecified organism: Principal | ICD-10-CM | POA: Diagnosis present

## 2016-04-11 HISTORY — DX: Encounter for other specified aftercare: Z51.89

## 2016-04-11 HISTORY — DX: Polyp of colon: K63.5

## 2016-04-11 HISTORY — DX: Malignant (primary) neoplasm, unspecified: C80.1

## 2016-04-11 LAB — URINALYSIS, ROUTINE W REFLEX MICROSCOPIC
Bilirubin Urine: NEGATIVE
Glucose, UA: NEGATIVE mg/dL
KETONES UR: NEGATIVE mg/dL
Leukocytes, UA: NEGATIVE
Nitrite: NEGATIVE
PH: 5 (ref 5.0–8.0)
Protein, ur: NEGATIVE mg/dL
SPECIFIC GRAVITY, URINE: 1.013 (ref 1.005–1.030)

## 2016-04-11 LAB — CBC WITH DIFFERENTIAL/PLATELET
Basophils Absolute: 0 10*3/uL (ref 0–0.1)
Basophils Relative: 0 %
EOS PCT: 0 %
Eosinophils Absolute: 0 10*3/uL (ref 0–0.7)
HEMATOCRIT: 33.8 % — AB (ref 35.0–47.0)
HEMOGLOBIN: 11.8 g/dL — AB (ref 12.0–16.0)
LYMPHS ABS: 0.8 10*3/uL — AB (ref 1.0–3.6)
LYMPHS PCT: 3 %
MCH: 31.1 pg (ref 26.0–34.0)
MCHC: 35 g/dL (ref 32.0–36.0)
MCV: 88.9 fL (ref 80.0–100.0)
Monocytes Absolute: 1.3 10*3/uL — ABNORMAL HIGH (ref 0.2–0.9)
Monocytes Relative: 6 %
NEUTROS PCT: 91 %
Neutro Abs: 20.9 10*3/uL — ABNORMAL HIGH (ref 1.4–6.5)
Platelets: 282 10*3/uL (ref 150–440)
RBC: 3.8 MIL/uL (ref 3.80–5.20)
RDW: 13.4 % (ref 11.5–14.5)
WBC: 23 10*3/uL — AB (ref 3.6–11.0)

## 2016-04-11 LAB — COMPREHENSIVE METABOLIC PANEL
ALT: 25 U/L (ref 14–54)
AST: 38 U/L (ref 15–41)
Albumin: 4.3 g/dL (ref 3.5–5.0)
Alkaline Phosphatase: 68 U/L (ref 38–126)
Anion gap: 10 (ref 5–15)
BUN: 13 mg/dL (ref 6–20)
CHLORIDE: 95 mmol/L — AB (ref 101–111)
CO2: 30 mmol/L (ref 22–32)
Calcium: 9.8 mg/dL (ref 8.9–10.3)
Creatinine, Ser: 0.85 mg/dL (ref 0.44–1.00)
GFR calc Af Amer: >60 mL/min (ref >60)
Glucose, Bld: 120 mg/dL — ABNORMAL HIGH (ref 65–99)
POTASSIUM: 3.5 mmol/L (ref 3.5–5.1)
Sodium: 135 mmol/L (ref 135–145)
Total Bilirubin: 1.1 mg/dL (ref 0.3–1.2)
Total Protein: 7.3 g/dL (ref 6.5–8.1)

## 2016-04-11 LAB — PROCALCITONIN: Procalcitonin: 0.35 ng/mL

## 2016-04-11 LAB — TROPONIN I: Troponin I: <0.03 ng/mL (ref <0.03)

## 2016-04-11 LAB — LACTIC ACID, PLASMA
LACTIC ACID, VENOUS: 1.7 mmol/L (ref 0.5–1.9)
LACTIC ACID, VENOUS: 2.6 mmol/L — AB (ref 0.5–1.9)
Lactic Acid, Venous: 1.6 mmol/L (ref 0.5–1.9)

## 2016-04-11 LAB — INFLUENZA PANEL BY PCR (TYPE A & B)
INFLAPCR: NEGATIVE
Influenza B By PCR: NEGATIVE

## 2016-04-11 MED ORDER — HYDROCHLOROTHIAZIDE 12.5 MG PO CAPS
12.5000 mg | ORAL_CAPSULE | Freq: Every day | ORAL | Status: DC
  Administered 2016-04-11 – 2016-04-15 (×5): 12.5 mg via ORAL
  Filled 2016-04-11 (×5): qty 1

## 2016-04-11 MED ORDER — PIPERACILLIN-TAZOBACTAM 3.375 G IVPB 30 MIN: 3.3750 g | Freq: Once | INTRAVENOUS | Status: DC

## 2016-04-11 MED ORDER — SODIUM CHLORIDE 0.9 % IV SOLN
INTRAVENOUS | Status: DC
  Administered 2016-04-11 – 2016-04-13 (×4): via INTRAVENOUS

## 2016-04-11 MED ORDER — VITAMIN C 500 MG PO TABS
250.0000 mg | ORAL_TABLET | Freq: Every day | ORAL | Status: DC
  Administered 2016-04-11 – 2016-04-15 (×5): 250 mg via ORAL
  Filled 2016-04-11 (×3): qty 1
  Filled 2016-04-11: qty 0.5
  Filled 2016-04-11 (×2): qty 1

## 2016-04-11 MED ORDER — SODIUM CHLORIDE 0.9 % IV BOLUS (SEPSIS)
1000.0000 mL | Freq: Once | INTRAVENOUS | Status: AC
  Administered 2016-04-11: 1000 mL via INTRAVENOUS

## 2016-04-11 MED ORDER — SENNOSIDES-DOCUSATE SODIUM 8.6-50 MG PO TABS: 1.0000 | ORAL_TABLET | Freq: Every evening | ORAL | Status: DC | PRN

## 2016-04-11 MED ORDER — SODIUM CHLORIDE 0.9 % IV SOLN
INTRAVENOUS | Status: DC
  Administered 2016-04-11: 05:00:00 via INTRAVENOUS

## 2016-04-11 MED ORDER — VANCOMYCIN HCL IN DEXTROSE 1-5 GM/200ML-% IV SOLN
1000.0000 mg | Freq: Once | INTRAVENOUS | Status: AC
  Administered 2016-04-11: 1000 mg via INTRAVENOUS
  Filled 2016-04-11: qty 200

## 2016-04-11 MED ORDER — PIPERACILLIN-TAZOBACTAM 3.375 G IVPB
3.3750 g | Freq: Three times a day (TID) | INTRAVENOUS | Status: DC
  Administered 2016-04-11 – 2016-04-12 (×4): 3.375 g via INTRAVENOUS
  Filled 2016-04-11 (×4): qty 50

## 2016-04-11 MED ORDER — ATENOLOL 25 MG PO TABS
25.0000 mg | ORAL_TABLET | Freq: Every day | ORAL | Status: DC
  Administered 2016-04-11 – 2016-04-15 (×5): 25 mg via ORAL
  Filled 2016-04-11 (×6): qty 1

## 2016-04-11 MED ORDER — VANCOMYCIN HCL IN DEXTROSE 1-5 GM/200ML-% IV SOLN: 1000.0000 mg | Freq: Once | INTRAVENOUS | Status: DC

## 2016-04-11 MED ORDER — ASPIRIN EC 81 MG PO TBEC
81.0000 mg | DELAYED_RELEASE_TABLET | Freq: Every day | ORAL | Status: DC
  Administered 2016-04-11 – 2016-04-15 (×5): 81 mg via ORAL
  Filled 2016-04-11 (×5): qty 1

## 2016-04-11 MED ORDER — VANCOMYCIN HCL IN DEXTROSE 750-5 MG/150ML-% IV SOLN
750.0000 mg | Freq: Two times a day (BID) | INTRAVENOUS | Status: DC
  Administered 2016-04-11 – 2016-04-12 (×3): 750 mg via INTRAVENOUS
  Filled 2016-04-11 (×4): qty 150

## 2016-04-11 MED ORDER — RISAQUAD PO CAPS
1.0000 | ORAL_CAPSULE | Freq: Three times a day (TID) | ORAL | Status: DC
  Administered 2016-04-11 – 2016-04-15 (×11): 1 via ORAL
  Filled 2016-04-11 (×11): qty 1

## 2016-04-11 MED ORDER — ACETAMINOPHEN 325 MG PO TABS
650.0000 mg | ORAL_TABLET | Freq: Four times a day (QID) | ORAL | Status: DC | PRN
  Administered 2016-04-11: 650 mg via ORAL
  Filled 2016-04-11: qty 2

## 2016-04-11 MED ORDER — POTASSIUM CHLORIDE CRYS ER 20 MEQ PO TBCR
20.0000 meq | EXTENDED_RELEASE_TABLET | Freq: Once | ORAL | Status: AC
  Administered 2016-04-11: 05:00:00 20 meq via ORAL
  Filled 2016-04-11: qty 1

## 2016-04-11 MED ORDER — PIPERACILLIN-TAZOBACTAM 3.375 G IVPB 30 MIN
3.3750 g | Freq: Once | INTRAVENOUS | Status: AC
  Administered 2016-04-11: 3.375 g via INTRAVENOUS
  Filled 2016-04-11: qty 50

## 2016-04-11 MED ORDER — PANTOPRAZOLE SODIUM 40 MG PO TBEC
40.0000 mg | DELAYED_RELEASE_TABLET | Freq: Every day | ORAL | Status: DC
  Administered 2016-04-11 – 2016-04-15 (×5): 40 mg via ORAL
  Filled 2016-04-11 (×5): qty 1

## 2016-04-11 MED ORDER — BACID PO TABS: 2.0000 | ORAL_TABLET | Freq: Three times a day (TID) | ORAL | Status: DC

## 2016-04-11 MED ORDER — ACETAMINOPHEN 650 MG RE SUPP: 650.0000 mg | Freq: Four times a day (QID) | RECTAL | Status: DC | PRN

## 2016-04-11 MED ORDER — IRBESARTAN 150 MG PO TABS
150.0000 mg | ORAL_TABLET | Freq: Every day | ORAL | Status: DC
  Administered 2016-04-11 – 2016-04-15 (×5): 150 mg via ORAL
  Filled 2016-04-11 (×5): qty 1

## 2016-04-11 MED ORDER — ROSUVASTATIN CALCIUM 10 MG PO TABS
5.0000 mg | ORAL_TABLET | Freq: Every day | ORAL | Status: DC
  Administered 2016-04-11 – 2016-04-14 (×4): 5 mg via ORAL
  Filled 2016-04-11 (×4): qty 1

## 2016-04-11 MED ORDER — TELMISARTAN-HCTZ 40-12.5 MG PO TABS: 1.0000 | ORAL_TABLET | Freq: Every day | ORAL | Status: DC

## 2016-04-11 MED ORDER — ENOXAPARIN SODIUM 40 MG/0.4ML ~~LOC~~ SOLN
40.0000 mg | SUBCUTANEOUS | Status: DC
  Administered 2016-04-11 – 2016-04-14 (×4): 40 mg via SUBCUTANEOUS
  Filled 2016-04-11 (×5): qty 0.4

## 2016-04-11 MED ORDER — ONDANSETRON HCL 4 MG PO TABS: 4.0000 mg | ORAL_TABLET | Freq: Four times a day (QID) | ORAL | Status: DC | PRN

## 2016-04-11 MED ORDER — FOLIC ACID 1 MG PO TABS
1.0000 mg | ORAL_TABLET | Freq: Every day | ORAL | Status: DC
  Administered 2016-04-11 – 2016-04-15 (×5): 1 mg via ORAL
  Filled 2016-04-11 (×5): qty 1

## 2016-04-11 MED ORDER — NORTRIPTYLINE HCL 25 MG PO CAPS
25.0000 mg | ORAL_CAPSULE | Freq: Two times a day (BID) | ORAL | Status: DC
  Administered 2016-04-11 – 2016-04-12 (×2): 25 mg via ORAL
  Filled 2016-04-11 (×9): qty 1

## 2016-04-11 MED ORDER — HYDROCODONE-ACETAMINOPHEN 5-325 MG PO TABS
1.0000 | ORAL_TABLET | ORAL | Status: DC | PRN
  Administered 2016-04-11: 1 via ORAL
  Administered 2016-04-11 – 2016-04-13 (×6): 2 via ORAL
  Filled 2016-04-11 (×6): qty 2
  Filled 2016-04-11: qty 1

## 2016-04-11 MED ORDER — GABAPENTIN 300 MG PO CAPS
300.0000 mg | ORAL_CAPSULE | Freq: Two times a day (BID) | ORAL | Status: DC
  Administered 2016-04-11 – 2016-04-15 (×9): 300 mg via ORAL
  Filled 2016-04-11 (×9): qty 1

## 2016-04-11 MED ORDER — ONDANSETRON HCL 4 MG/2ML IJ SOLN: 4.0000 mg | Freq: Four times a day (QID) | INTRAMUSCULAR | Status: DC | PRN

## 2016-04-11 NOTE — ED Triage Notes (Signed)
Patient with onset of body aches, neck pain, chills and breast swelling and redness. Thinks she has the flu. States history of breast cancer 20 years ago and today noticed right breast pain, redness and swelling. Denies N/V.

## 2016-04-11 NOTE — ED Notes (Signed)
Eliezer Lofts RN made 2 unsuccessful attempts at IV placement. Ena Dawley RN to bedside to attempt ultrasound IV placement

## 2016-04-11 NOTE — ED Notes (Signed)
MD Kinner at bedside  

## 2016-04-11 NOTE — ED Provider Notes (Signed)
[facility] Emergency Department Provider Note   ____________________________________________    I have reviewed the triage vital signs and the nursing notes.   HISTORY  Chief Complaint Breast Pain (History of Breast CA. Has swelling tonight); Generalized Body Aches; Chills; and Fever     HPI Veronica Cisneros is a 65 y.o. female who presents with redness to the right breast, fever or chills and weakness. Patient reports a history of breast cancer 20 years ago in her right breast. She reports this morning she had chills and thought she was coming down with the flu. However when she took her bra she noticed redness along her right breast. She does have a mild cough, she denies dysuria. Area of redness is tender to the touch. Took Tylenol prior to arrival for fever Past Medical History:  Diagnosis Date  . breast cancer   . Bursitis   . Colon polyps   . High cholesterol   . Hypertension   . IBS (irritable bowel syndrome)   . Spinal stenosis     Patient Active Problem List   Diagnosis Date Noted  . Pain in limb 02/18/2016  . Swelling of limb 02/18/2016  . Essential hypertension 02/18/2016  . Hyperlipidemia 02/18/2016    Past Surgical History:  Procedure Laterality Date  . ABDOMINAL HYSTERECTOMY    . BREAST SURGERY    . colonscopy    . FL INJ RIGHT KNEE MR ARTHROGRAM ([facility] HX)    . LYMPH GLAND EXCISION    . SPINE SURGERY      Prior to Admission medications   Medication Sig Start Date End Date Taking? Authorizing Provider  acetaminophen (TYLENOL) 650 MG CR tablet Take by mouth.    Historical Provider, MD  ascorbic acid (VITAMIN C) 100 MG tablet Take 100 mg by mouth daily.    Historical Provider, MD  aspirin EC 81 MG tablet Take 81 mg by mouth. 06/12/12   Historical Provider, MD  atenolol (TENORMIN) 25 MG tablet  12/29/15   Historical Provider, MD  Cholecalciferol (VITAMIN D3) 1000 units CAPS Take by mouth.    Historical Provider, MD    Cholecalciferol 1000 units CHEW Chew by mouth. 06/19/10   Historical Provider, MD  folic acid (FOLVITE) 1 MG tablet Take 1 mg by mouth.    Historical Provider, MD  gabapentin (NEURONTIN) 300 MG capsule 1 po bid 06/02/15   Historical Provider, MD  meloxicam (MOBIC) 15 MG tablet Take 15 mg by mouth. 06/12/14   Historical Provider, MD  nortriptyline (PAMELOR) 25 MG capsule Take by mouth. 12/31/15 12/30/16  Historical Provider, MD  nystatin cream (MYCOSTATIN) Apply topically. 02/11/16   Historical Provider, MD  nystatin cream (MYCOSTATIN)  02/12/16   Historical Provider, MD  pantoprazole (PROTONIX) 40 MG tablet Take by mouth. 03/05/15   Historical Provider, MD  RA KRILL OIL 500 MG CAPS Take by mouth.    Historical Provider, MD  rosuvastatin (CRESTOR) 5 MG tablet TAKE ONE TABLET AT BEDTIME 11/21/13   Historical Provider, MD  Salicylic Acid 6 % CREA Apply 1 Application topically 2 (two) times daily as needed. 12/05/15   Historical Provider, MD  telmisartan-hydrochlorothiazide (MICARDIS HCT) 40-12.5 MG tablet  02/12/16   Historical Provider, MD     Allergies Valium [diazepam]; Cephalexin; and Ciprofloxacin  Family History  Problem Relation Age of Onset  . Hypertension Mother     Social History Social History  Substance Use Topics  . Smoking status: Never Smoker  . Smokeless tobacco:  Never Used  . Alcohol use Yes    Review of Systems  Constitutional: Positive fevers Eyes: No visual changes.   Cardiovascular: Denies chest pain. Respiratory: Positive cough Gastrointestinal: No abdominal pain.  No nausea, no vomiting.   Genitourinary: Negative for dysuria. Musculoskeletal: Negative for back pain. Skin: As above Neurological: Negative for headaches   10-point ROS otherwise negative.  ____________________________________________   PHYSICAL EXAM:  VITAL SIGNS: ED Triage Vitals  Enc Vitals Group     BP 04/11/16 0021 (!) 162/88     Pulse Rate 04/11/16 0021 (!) 122     Resp 04/11/16  0021 18     Temp 04/11/16 0021 100.1 F (37.8 C)     Temp Source 04/11/16 0021 Oral     SpO2 04/11/16 0021 95 %     Weight 04/11/16 0016 183 lb (83 kg)     Height 04/11/16 0016 5\' 4"  (1.626 m)     Head Circumference --      Peak Flow --      Pain Score 04/11/16 0016 5     Pain Loc --      Pain Edu? --      Excl. in [place]? --     Constitutional: Alert and oriented. No acute distress. Pleasant and interactive Eyes: Conjunctivae are normal.  Nose: No congestion/rhinnorhea. Mouth/Throat: Mucous membranes are moist.    Cardiovascular: Tachycardia, regular rhythm.   Good peripheral circulation. Respiratory: Normal respiratory effort.  No retractions. Gastrointestinal: Soft and nontender. No distention.  No CVA tenderness. Genitourinary: deferred Musculoskeletal: No lower extremity tenderness nor edema.  Warm and well perfused Neurologic:  Normal speech and language. No gross focal neurologic deficits are appreciated.  Skin:  Skin is warm, dry and intact. No erythema extending over the entire right breast which is mildly swollen although no areas of fluctuance, no abscess noted, erythema extends around her right chest wall to her back, is tender to palpation, no evidence of herpetic rash Psychiatric: Mood and affect are normal. Speech and behavior are normal.  ____________________________________________   LABS (all labs ordered are listed, but only abnormal results are displayed)  Labs Reviewed  COMPREHENSIVE METABOLIC PANEL - Abnormal; Notable for the following:       Result Value   Chloride 95 (*)    Glucose, Bld 120 (*)    All other components within normal limits  CBC WITH DIFFERENTIAL/PLATELET - Abnormal; Notable for the following:    WBC 23.0 (*)    Hemoglobin 11.8 (*)    HCT 33.8 (*)    Neutro Abs 20.9 (*)    Lymphs Abs 0.8 (*)    Monocytes Absolute 1.3 (*)    All other components within normal limits  CULTURE, BLOOD (ROUTINE X 2)  CULTURE, BLOOD (ROUTINE X 2)  URINE  CULTURE  LACTIC ACID, PLASMA  TROPONIN I  LACTIC ACID, PLASMA  URINALYSIS, ROUTINE W REFLEX MICROSCOPIC  PROCALCITONIN  INFLUENZA PANEL BY PCR (TYPE A & B, H1N1)   ____________________________________________  EKG  None ____________________________________________  RADIOLOGY  Chest x-ray unremarkable ____________________________________________   PROCEDURES  Procedure(s) performed: No    Critical Care performed:No ____________________________________________   INITIAL IMPRESSION / ASSESSMENT AND PLAN / ED COURSE  Pertinent labs & imaging results that were available during my care of the patient were reviewed by me and considered in my medical decision making (see chart for details).  Patient presents with fever, tachycardia, chills and evidence of cellulitis. Code sepsis called, IV vancomycin and Zosyn ordered, will  start with 1 L of IV fluid pending labs.  Clinical Course   Patient with elevated white blood cell count consistent with sepsis, lactic acid is less than 2, no evidence of organ failure ____________________________________________   FINAL CLINICAL IMPRESSION(S) / ED DIAGNOSES  Final diagnoses:  Sepsis, due to unspecified organism ([place])  Cellulitis of chest wall      NEW MEDICATIONS STARTED DURING THIS VISIT:  New Prescriptions   No medications on file     Note:  This document was prepared using Dragon voice recognition software and may include unintentional dictation errors.    Lavonia Drafts, MD 04/11/16 [phone]

## 2016-04-11 NOTE — ED Notes (Signed)
RN marked areas of redness with surgical marker

## 2016-04-11 NOTE — Progress Notes (Signed)
Admitted this morning because of right breast cellulitis, chest wall cellulitis. Started on IV, Zosyn. Patient to feels better than admission. Redness around right breast area, in duration, chest wall cellulitis are improving. Patient is a breast cancer survivor, goes for her annual mammogram scheduled.  Labs reviewed.  Medications are reviewed. Vitals are stable  Except Low-grade temperature  Sepsis; secondary to Right breast mastitis, chest wall cellulitis on the right side: Continue IV antibiotics  #2 leukocytosis secondary to sepsis from breast cellulitis: Check the white count closely. Many antibiotics, follow blood cultures.  Spinal stenosis: Continue ambulation,  4. hypokalemia: Replaced the potassium.  Time spent 25 minute

## 2016-04-11 NOTE — Progress Notes (Signed)
Pharmacy Antibiotic Note  Veronica Cisneros is a 65 y.o. female admitted on 04/11/2016 with sepsis and cellulitis.  Pharmacy has been consulted for Vancomycin and Zosyn dosing.  Ke=0.06, t1/2=11hr, Vd=46.2L  Plan: Vancomycin 750 IV every 12 hours.  Goal trough 15-20 mcg/mL. Zosyn 3.375g IV q8h (4 hour infusion). Vanc Trough due prior to 5th dose.  Height: 5\' 4"  (162.6 cm) Weight: 183 lb (83 kg) IBW/kg (Calculated) : 54.7  Temp (24hrs), Avg:100.1 F (37.8 C), Min:100.1 F (37.8 C), Max:100.1 F (37.8 C)   Recent Labs Lab 04/11/16 0120 04/11/16 0148  WBC 23.0*  --   CREATININE 0.85  --   LATICACIDVEN  --  1.7    Estimated Creatinine Clearance: 66.9 mL/min (by C-G formula based on SCr of 0.85 mg/dL).    Allergies  Allergen Reactions  . Valium [Diazepam] Other (See Comments)  . Cephalexin Nausea Only  . Ciprofloxacin Diarrhea and Nausea Only    Antimicrobials this admission: Vancomycin 12/10 >>  Zosyn 12/10 >>   Dose adjustments this admission:  Microbiology results:   Thank you for allowing pharmacy to be a part of this patient's care.  Paulina Fusi, PharmD, BCPS 04/11/2016 3:32 AM

## 2016-04-11 NOTE — H&P (Signed)
[facility] [place] NAME: Veronica Cisneros    MR#:  [id number]  DATE OF BIRTH:  05/06/1950  DATE OF ADMISSION:  04/11/2016  PRIMARY CARE PHYSICIAN: Tama High III, MD   REQUESTING/REFERRING PHYSICIAN:   CHIEF COMPLAINT:   Chief Complaint  Patient presents with  . Breast Pain    History of Breast CA. Has swelling tonight  . Generalized Body Aches  . Chills  . Fever    HISTORY OF PRESENT ILLNESS: Veronica Cisneros  is a 65 y.o. female with a known history of Right breast cancer, hyperlipidemia, hypertension, irritable bowel syndrome, spinal stenosis presented to the emergency room with swelling, tenderness and redness of the right breast since yesterday afternoon. Patient noticed all of a sudden yesterday afternoon increased tenderness in the right breast with the redness of the skin or the breast which was extending to the back of the chest wall on the right side. No history of any trauma to the right side of the chest. Patient has breast cancer since last 20 years and she had one similar complaints in the past. Had low-grade fever of 100.23F. No complaints of any nausea or vomiting. Had chills and chest wall tenderness. The chest wall tenderness is aching in nature 5 out of 10 on a scale of 1-10. Patient was evaluated in the emergency room was given IV antibiotics vancomycin and Zosyn for cellulitis of the chest wall and code sepsis was called. Patient had fever low-grade and tachycardia and elevated WBC count.  PAST MEDICAL HISTORY:   Past Medical History:  Diagnosis Date  . breast cancer   . Bursitis   . Colon polyps   . High cholesterol   . Hypertension   . IBS (irritable bowel syndrome)   . Spinal stenosis     PAST SURGICAL HISTORY: Past Surgical History:  Procedure Laterality Date  . ABDOMINAL HYSTERECTOMY    . BREAST SURGERY    . colonscopy    . FL INJ RIGHT KNEE MR ARTHROGRAM ([facility] HX)    . LYMPH GLAND EXCISION    . SPINE  SURGERY      SOCIAL HISTORY:  Social History  Substance Use Topics  . Smoking status: Never Smoker  . Smokeless tobacco: Never Used  . Alcohol use Yes    FAMILY HISTORY:  Family History  Problem Relation Age of Onset  . Hypertension Mother     DRUG ALLERGIES:  Allergies  Allergen Reactions  . Valium [Diazepam] Other (See Comments)  . Cephalexin Nausea Only  . Ciprofloxacin Diarrhea and Nausea Only    REVIEW OF SYSTEMS:   CONSTITUTIONAL: Low grade fever, No fatigue or weakness.  EYES: No blurred or double vision.  EARS, NOSE, AND THROAT: No tinnitus or ear pain.  RESPIRATORY: No cough, shortness of breath, wheezing or hemoptysis.  CARDIOVASCULAR: No chest pain, orthopnea, edema.  GASTROINTESTINAL: No nausea, vomiting, diarrhea or abdominal pain.  GENITOURINARY: No dysuria, hematuria.  ENDOCRINE: No polyuria, nocturia,  HEMATOLOGY: No anemia, easy bruising or bleeding SKIN: Redness over skin of right breast with tenderness and swelling of right breast MUSCULOSKELETAL: No joint pain or arthritis.   NEUROLOGIC: No tingling, numbness, weakness.  PSYCHIATRY: No anxiety or depression.   MEDICATIONS AT HOME:  Prior to Admission medications   Medication Sig Start Date End Date Taking? Authorizing Provider  acetaminophen (TYLENOL) 650 MG CR tablet Take 1,300 mg by mouth 2 (two) times daily.    Yes Historical Provider, MD  ascorbic acid (VITAMIN C) 100 MG tablet Take 100 mg by mouth daily.   Yes Historical Provider, MD  aspirin EC 81 MG tablet Take 81 mg by mouth. 06/12/12  Yes Historical Provider, MD  atenolol (TENORMIN) 25 MG tablet Take 25 mg by mouth daily.  12/29/15  Yes Historical Provider, MD  Cholecalciferol (VITAMIN D3) 1000 units CAPS Take by mouth.   Yes Historical Provider, MD  folic acid (FOLVITE) 1 MG tablet Take 1 mg by mouth.   Yes Historical Provider, MD  gabapentin (NEURONTIN) 300 MG capsule 1 po bid 06/02/15  Yes Historical Provider, MD  meloxicam (MOBIC) 15 MG  tablet Take 15 mg by mouth. 06/12/14  Yes Historical Provider, MD  nortriptyline (PAMELOR) 25 MG capsule Take 25 mg by mouth at bedtime.  12/31/15 12/30/16 Yes Historical Provider, MD  nystatin (NYSTATIN) powder Apply 1 g topically 4 (four) times daily.   Yes Historical Provider, MD  nystatin cream (MYCOSTATIN) Apply topically. 02/11/16  Yes Historical Provider, MD  pantoprazole (PROTONIX) 40 MG tablet Take 40 mg by mouth daily.  03/05/15  Yes Historical Provider, MD  RA KRILL OIL 500 MG CAPS Take 1 capsule by mouth daily.    Yes Historical Provider, MD  rosuvastatin (CRESTOR) 5 MG tablet TAKE ONE TABLET AT BEDTIME 11/21/13  Yes Historical Provider, MD  telmisartan-hydrochlorothiazide (MICARDIS HCT) 40-12.5 MG tablet  02/12/16  Yes Historical Provider, MD      PHYSICAL EXAMINATION:   VITAL SIGNS: Blood pressure (!) 214/68, pulse (!) 106, temperature 100.1 F (37.8 C), temperature source Oral, resp. rate 20, height 5\' 4"  (1.626 m), weight 83 kg (183 lb), SpO2 100 %.  GENERAL:  65 y.o.-year-old patient lying in the bed with no acute distress.  EYES: Pupils equal, round, reactive to light and accommodation. No scleral icterus. Extraocular muscles intact.  HEENT: Head atraumatic, normocephalic. Oropharynx and nasopharynx clear.  NECK:  Supple, no jugular venous distention. No thyroid enlargement, no tenderness.  LUNGS: Normal breath sounds bilaterally, no wheezing, rales,rhonchi or crepitation. No use of accessory muscles of respiration.  Breasts : tenderness right breast with redness and swelling noted. CARDIOVASCULAR: S1, S2 normal. No murmurs, rubs, or gallops.  ABDOMEN: Soft, nontender, nondistended. Bowel sounds present. No organomegaly or mass.  EXTREMITIES: No pedal edema, cyanosis, or clubbing.  NEUROLOGIC: Cranial nerves II through XII are intact. Muscle strength 5/5 in all extremities. Sensation intact. Gait not checked.  PSYCHIATRIC: The patient is alert and oriented x 3.  SKIN: Redness  of skin over right breast and right chest wall extending posteriorly.  LABORATORY PANEL:   CBC  Recent Labs Lab 04/11/16 0120  WBC 23.0*  HGB 11.8*  HCT 33.8*  PLT 282  MCV 88.9  MCH 31.1  MCHC 35.0  RDW 13.4  LYMPHSABS 0.8*  MONOABS 1.3*  EOSABS 0.0  BASOSABS 0.0   ------------------------------------------------------------------------------------------------------------------  Chemistries   Recent Labs Lab 04/11/16 0120  NA 135  K 3.5  CL 95*  CO2 30  GLUCOSE 120*  BUN 13  CREATININE 0.85  CALCIUM 9.8  AST 38  ALT 25  ALKPHOS 68  BILITOT 1.1   ------------------------------------------------------------------------------------------------------------------ estimated creatinine clearance is 66.9 mL/min (by C-G formula based on SCr of 0.85 mg/dL). ------------------------------------------------------------------------------------------------------------------ No results for input(s): TSH, T4TOTAL, T3FREE, THYROIDAB in the last 72 hours.  Invalid input(s): FREET3   Coagulation profile No results for input(s): INR, PROTIME in the last 168 hours. ------------------------------------------------------------------------------------------------------------------- No results for input(s): DDIMER in the last 72 hours. -------------------------------------------------------------------------------------------------------------------  Cardiac  Enzymes  Recent Labs Lab 04/11/16 0120  TROPONINI <0.03   ------------------------------------------------------------------------------------------------------------------ Invalid input(s): POCBNP  ---------------------------------------------------------------------------------------------------------------  Urinalysis    Component Value Date/Time   COLORURINE YELLOW (A) 04/11/2016 0120   APPEARANCEUR CLEAR (A) 04/11/2016 0120   LABSPEC 1.013 04/11/2016 0120   PHURINE 5.0 04/11/2016 0120   GLUCOSEU NEGATIVE  04/11/2016 0120   HGBUR SMALL (A) 04/11/2016 0120   BILIRUBINUR NEGATIVE 04/11/2016 0120   KETONESUR NEGATIVE 04/11/2016 0120   PROTEINUR NEGATIVE 04/11/2016 0120   NITRITE NEGATIVE 04/11/2016 0120   LEUKOCYTESUR NEGATIVE 04/11/2016 0120     RADIOLOGY: Dg Chest Port 1 View  Result Date: 04/11/2016 CLINICAL DATA:  Right breast cellulitis. EXAM: PORTABLE CHEST 1 VIEW COMPARISON:  None. FINDINGS: A single AP portable view of the chest demonstrates no focal airspace consolidation or alveolar edema. The lungs are grossly clear. There is no large effusion or pneumothorax. Cardiac and mediastinal contours appear unremarkable. IMPRESSION: No active disease. Electronically Signed   By: Andreas Newport M.D.   On: 04/11/2016 01:33    EKG: No orders found for this or any previous visit.  IMPRESSION AND PLAN: 65 year old female patient with history of right breast cancer, spinal stenosis, hypertension, hyperlipidemia presented to the emergency room with tenderness, redness and swelling of the right breast extending into the right chest wall posteriorly. Admitting diagnosis 1. Cellulitis of the right chest wall 2. Mastitis right breast 3. Sepsis 4. Leukocytosis 5. History of breast cancer 6. Spinal stenosis Treatment plan Admit patient to medical floor IV fluid hydration Start patient on IV vancomycin and IV Zosyn antibiotics Follow-up cultures Loose fitting garment Resume home medications DVT prophylaxis subcutaneous Lovenox 40 MG daily Supportive care All the records are reviewed and case discussed with ED provider. Management plans discussed with the patient, family and they are in agreement.  CODE STATUS:FULL CODE Surrogate decision maker : husband Albertina Parr Code Status History    This patient does not have a recorded code status. Please follow your organizational policy for patients in this situation.       TOTAL TIME TAKING CARE OF THIS PATIENT: 51 minutes.    Saundra Shelling M.D on 04/11/2016 at 3:37 AM  Between 7am to 6pm - Pager - [phone]  After 6pm go to [email or web address] - password [email or web address] [facility]  [facility]  Office  [phone]  CC: Primary care physician; Adin Hector, MD

## 2016-04-11 NOTE — ED Notes (Signed)
Dr Leroy Libman in to see pt for admission. Urine spec to the lab.

## 2016-04-11 NOTE — ED Notes (Signed)
Pt c/o cellulitis of right breast. Pt reports hx of the same 3 years ago. Pt reports hx of breast cancer/radiation treatments to right breast. Pt has prescribed nystatin ointment under breast.   Pt's right breast is reddened and warm to touch.

## 2016-04-11 NOTE — ED Notes (Signed)
Pt reports she had chills earlier tonight and took 650 mg tylenol at 2000, 2315 on 12/9.

## 2016-04-12 ENCOUNTER — Inpatient Hospital Stay: Payer: BLUE CROSS/BLUE SHIELD

## 2016-04-12 DIAGNOSIS — K588 Other irritable bowel syndrome: Secondary | ICD-10-CM

## 2016-04-12 DIAGNOSIS — Z923 Personal history of irradiation: Secondary | ICD-10-CM

## 2016-04-12 DIAGNOSIS — E7800 Pure hypercholesterolemia, unspecified: Secondary | ICD-10-CM

## 2016-04-12 DIAGNOSIS — N61 Mastitis without abscess: Secondary | ICD-10-CM

## 2016-04-12 DIAGNOSIS — M4800 Spinal stenosis, site unspecified: Secondary | ICD-10-CM

## 2016-04-12 DIAGNOSIS — Z79899 Other long term (current) drug therapy: Secondary | ICD-10-CM

## 2016-04-12 DIAGNOSIS — Z170 Estrogen receptor positive status [ER+]: Secondary | ICD-10-CM

## 2016-04-12 DIAGNOSIS — Z853 Personal history of malignant neoplasm of breast: Secondary | ICD-10-CM

## 2016-04-12 DIAGNOSIS — I10 Essential (primary) hypertension: Secondary | ICD-10-CM

## 2016-04-12 LAB — CBC
HEMATOCRIT: 30.5 % — AB (ref 35.0–47.0)
Hemoglobin: 10.3 g/dL — ABNORMAL LOW (ref 12.0–16.0)
MCH: 30.5 pg (ref 26.0–34.0)
MCHC: 33.9 g/dL (ref 32.0–36.0)
MCV: 89.8 fL (ref 80.0–100.0)
Platelets: 216 10*3/uL (ref 150–440)
RBC: 3.39 MIL/uL — ABNORMAL LOW (ref 3.80–5.20)
RDW: 13.7 % (ref 11.5–14.5)
WBC: 11.4 10*3/uL — AB (ref 3.6–11.0)

## 2016-04-12 LAB — URINE CULTURE: Culture: <10000 — AB

## 2016-04-12 MED ORDER — HYDROCODONE-ACETAMINOPHEN 5-325 MG PO TABS: 1.0000 | ORAL_TABLET | ORAL | 0 refills | Status: DC | PRN

## 2016-04-12 MED ORDER — CLINDAMYCIN PHOSPHATE 600 MG/50ML IV SOLN
600.0000 mg | Freq: Three times a day (TID) | INTRAVENOUS | Status: DC
  Administered 2016-04-12 – 2016-04-15 (×9): 600 mg via INTRAVENOUS
  Filled 2016-04-12 (×11): qty 50

## 2016-04-12 MED ORDER — PREDNISONE 5 MG PO TABS
50.0000 mg | ORAL_TABLET | Freq: Two times a day (BID) | ORAL | Status: DC
  Administered 2016-04-13: 12:00:00 50 mg via ORAL
  Filled 2016-04-12: qty 2

## 2016-04-12 MED ORDER — CLINDAMYCIN HCL 300 MG PO CAPS: 300.0000 mg | ORAL_CAPSULE | Freq: Three times a day (TID) | ORAL | 0 refills | Status: DC

## 2016-04-12 NOTE — Progress Notes (Signed)
[facility] [place] NAME: Veronica Cisneros    MR#:  [id number]  DATE OF BIRTH:  Nov 30, 1950  SUBJECTIVE: Right breast mastitis is a worse today and did not pass any of the firmness present in the lower quadrant of the right breast. Patient has history of right breast cancer, now in remission getting annual mammograms. We will see down, admitted for sepsis. More erythematous and more tender today than yesterday   CHIEF COMPLAINT:   Chief Complaint  Patient presents with  . Breast Pain    History of Breast CA. Has swelling tonight  . Generalized Body Aches  . Chills  . Fever    REVIEW OF SYSTEMS:   ROS CONSTITUTIONAL: No fever, fatigue or weakness.  EYES: No blurred or double vision.  EARS, NOSE, AND THROAT: No tinnitus or ear pain.  RESPIRATORY: No cough, shortness of breath, wheezing or hemoptysis.  CARDIOVASCULAR: No chest pain, orthopnea, edema.  GASTROINTESTINAL: No nausea, vomiting, diarrhea or abdominal pain.  GENITOURINARY: No dysuria, hematuria.  ENDOCRINE: No polyuria, nocturia,  HEMATOLOGY: No anemia, easy bruising or bleeding SKIN: breast erythema,tenderness.no nipple discharge   MUSCULOSKELETAL: No jonitenderness / .   NEUROLOGIC: No tingling, numbness, weakness.  PSYCHIATRY: No anxiety or depression.   DRUG ALLERGIES:   Allergies  Allergen Reactions  . Valium [Diazepam] Other (See Comments)  . Cephalexin Nausea Only  . Ciprofloxacin Diarrhea and Nausea Only    VITALS:  Blood pressure (!) 141/71, pulse 89, temperature 98.7 F (37.1 C), temperature source Oral, resp. rate 16, height 5\' 4"  (1.626 m), weight 87.6 kg (193 lb 3.2 oz), SpO2 91 %.  PHYSICAL EXAMINATION:  GENERAL:  65 y.o.-year-old patient lying in the bed with no acute distress.  EYES: Pupils equal, round, reactive to light and accommodation. No scleral icterus. Extraocular muscles intact.  HEENT: Head atraumatic, normocephalic. Oropharynx and  nasopharynx clear.  NECK:  Supple, no jugular venous distention. No thyroid enlargement, no tenderness.  LUNGS: Normal breath sounds bilaterally, no wheezing, rales,rhonchi or crepitation. No use of accessory muscles of respiration.  CARDIOVASCULAR: S1, S2 normal. No murmurs, rubs, or gallops.  ABDOMEN: Soft, nontender, nondistended. Bowel sounds present. No organomegaly or mass.  EXTREMITIES: No pedal edema, cyanosis, or clubbing.  NEUROLOGIC: Cranial nerves II through XII are intact. Muscle strength 5/5 in all extremities. Sensation intact. Gait not checked.  PSYCHIATRIC: The patient is alert and oriented x 3.  SKIn; erythema of the right breast, tenderness and firmness in the right breast lower quadrant, cellulitis extending to the back of the right side chest wall. LABORATORY PANEL:   CBC  Recent Labs Lab 04/12/16 0450  WBC 11.4*  HGB 10.3*  HCT 30.5*  PLT 216   ------------------------------------------------------------------------------------------------------------------  Chemistries   Recent Labs Lab 04/11/16 0120  NA 135  K 3.5  CL 95*  CO2 30  GLUCOSE 120*  BUN 13  CREATININE 0.85  CALCIUM 9.8  AST 38  ALT 25  ALKPHOS 68  BILITOT 1.1   ------------------------------------------------------------------------------------------------------------------  Cardiac Enzymes  Recent Labs Lab 04/11/16 0120  TROPONINI <0.03   ------------------------------------------------------------------------------------------------------------------  RADIOLOGY:  Dg Chest Port 1 View  Result Date: 04/11/2016 CLINICAL DATA:  Right breast cellulitis. EXAM: PORTABLE CHEST 1 VIEW COMPARISON:  None. FINDINGS: A single AP portable view of the chest demonstrates no focal airspace consolidation or alveolar edema. The lungs are grossly clear. There is no large effusion or pneumothorax. Cardiac and mediastinal contours appear unremarkable. IMPRESSION: No active disease.  Electronically Signed   By: Andreas Newport M.D.   On: 04/11/2016 01:33   US Breast Ltd Uni Right Inc Axilla  Result Date: 04/12/2016 CLINICAL DATA:  65 year old female with diffuse right breast pain, swelling and redness for 2 days. History of right breast cancer. EXAM: ULTRASOUND OF THE RIGHT BREAST COMPARISON:  None FINDINGS: On physical exam, diffuse right breast erythema noted. The right breast is firm. Targeted ultrasound is performed, showing right breast skin edema without focal collection or abscess. No suspicious mass, distortion or abnormal shadowing identified. IMPRESSION: Right breast mastitis.  No evidence of abscess or suspicious mass. RECOMMENDATION: Clinical followup. If symptoms do not improve, consider diagnostic mammogram. Bilateral screening mammograms in March 2018 to resume annual mammogram schedule. I have discussed the findings and recommendations with the patient. Results were also provided in writing at the conclusion of the visit. If applicable, a reminder letter will be sent to the patient regarding the next appointment. BI-RADS CATEGORY  2: Benign. Electronically Signed   By: Margarette Canada M.D.   On: 04/12/2016 12:50    EKG:  No orders found for this or any previous visit.  ASSESSMENT AND PLAN:   1.Right breast erythema, right breast mastitis. Sepsis present on admission secondary to right breast mastitis. Patient has redness, tenderness worse today. Ordered the ultrasound of the right breast did not show any abscess Or suspicious mass. Continue IV antibiotics, WBC is down ,surgery consult ,oncology consult placed because of history of right breast cancer and history of lumpectomy and lymph node removal on the right side. Discussed with patient about ultrasound results.   #2 history of spinal stenosis   All the records are reviewed and case discussed with Care Management/Social Workerr. Management plans discussed with the patient, family and they are in  agreement.  CODE STATUS: Stable   TOTAL TIME TAKING CARE OF THIS PATIENT: 71minutes.   POSSIBLE D/C IN 1-2DAYS, DEPENDING ON CLINICAL CONDITION.   Epifanio Lesches M.D on 04/12/2016 at 1:13 PM  Between 7am to 6pm - Pager - [phone]  After 6pm go to [email or web address] - password [email or web address] [facility]  Office  [phone]  CC: Primary care physician; Adin Hector, MD   Note: This dictation was prepared with Dragon dictation along with smaller phrase technology. Any transcriptional errors that result from this process are unintentional.

## 2016-04-12 NOTE — H&P (Signed)
Veronica Cisneros is an 65 y.o. female.   Chief Complaint: Right breast inflammation HPI:   65 year old female who has a history of hypertension, hyperlipidemia and right breast cancer 20 years prior here today with inflammation of the right breast. The patient had a lumpectomy and a right axillary node dissection to remove 23 lymph nodes when she was the age of 51. She underwent chemotherapy and radiation on my breasts. Approximately 3 years ago she had the same issue with an area of redness and inflammation and was admitted to [facility] at that time and they put her on a steroid taper which resolved the inflammation. She has not had any issues until Saturday she had a twinge of pain on the right side and began getting fever chills in the shakes so bad her husband how to hold her to keep her upright. The patient noticed some discomfort in the breast but not significant amount of pain.  When she removed her bra that night she had redness and inflammation the entirety of the breast as well as swelling. The patient has not noticed any drainage from the breast. Patient has had a candidal rash under both breasts were number of years which she uses nystatin powder for. Patient states that this is not similar to that. The patient otherwise has not had any acute issues since July when she had a GI bleed after a polyp removal.   Past Medical History:  Diagnosis Date  . Blood transfusion without reported diagnosis   . breast cancer   . Bursitis   . Colon polyps   . High cholesterol   . Hypertension   . IBS (irritable bowel syndrome)   . Spinal stenosis     Past Surgical History:  Procedure Laterality Date  . ABDOMINAL HYSTERECTOMY    . BREAST SURGERY    . colonscopy    . FL INJ RIGHT KNEE MR ARTHROGRAM ([facility] HX)    . LYMPH GLAND EXCISION    . SPINE SURGERY      Family History  Problem Relation Age of Onset  . Hypertension Mother    Social History:  reports that she has never smoked. She has  never used smokeless tobacco. She reports that she drinks about 12.6 oz of alcohol per week . She reports that she does not use drugs.  Allergies:  Allergies  Allergen Reactions  . Valium [Diazepam] Other (See Comments)  . Cephalexin Nausea Only  . Ciprofloxacin Diarrhea and Nausea Only    Medications Prior to Admission  Medication Sig Dispense Refill  . acetaminophen (TYLENOL) 650 MG CR tablet Take 1,300 mg by mouth 2 (two) times daily.     Marland Kitchen ascorbic acid (VITAMIN C) 100 MG tablet Take 100 mg by mouth daily.    Marland Kitchen aspirin EC 81 MG tablet Take 81 mg by mouth.    Marland Kitchen atenolol (TENORMIN) 25 MG tablet Take 25 mg by mouth daily.   2  . Cholecalciferol (VITAMIN D3) 1000 units CAPS Take by mouth.    . folic acid (FOLVITE) 1 MG tablet Take 1 mg by mouth.    . gabapentin (NEURONTIN) 300 MG capsule 1 po bid    . meloxicam (MOBIC) 15 MG tablet Take 15 mg by mouth.    . nortriptyline (PAMELOR) 25 MG capsule Take 25 mg by mouth at bedtime.     Marland Kitchen nystatin (NYSTATIN) powder Apply 1 g topically 4 (four) times daily.    Marland Kitchen nystatin cream (MYCOSTATIN) Apply topically.    Marland Kitchen  pantoprazole (PROTONIX) 40 MG tablet Take 40 mg by mouth daily.     Marland Kitchen RA KRILL OIL 500 MG CAPS Take 1 capsule by mouth daily.     . rosuvastatin (CRESTOR) 5 MG tablet TAKE ONE TABLET AT BEDTIME    . telmisartan-hydrochlorothiazide (MICARDIS HCT) 40-12.5 MG tablet   2    Results for orders placed or performed during the hospital encounter of 04/11/16 (from the past 48 hour(s))  Comprehensive metabolic panel     Status: Abnormal   Collection Time: 04/11/16  1:20 AM  Result Value Ref Range   Sodium 135 135 - 145 mmol/L   Potassium 3.5 3.5 - 5.1 mmol/L   Chloride 95 (L) 101 - 111 mmol/L   CO2 30 22 - 32 mmol/L   Glucose, Bld 120 (H) 65 - 99 mg/dL   BUN 13 6 - 20 mg/dL   Creatinine, Ser 0.85 0.44 - 1.00 mg/dL   Calcium 9.8 8.9 - 10.3 mg/dL   Total Protein 7.3 6.5 - 8.1 g/dL   Albumin 4.3 3.5 - 5.0 g/dL   AST 38 15 - 41 U/L   ALT  25 14 - 54 U/L   Alkaline Phosphatase 68 38 - 126 U/L   Total Bilirubin 1.1 0.3 - 1.2 mg/dL   GFR calc non Af Amer >60 >60 mL/min   GFR calc Af Amer >60 >60 mL/min    Comment: (NOTE) The eGFR has been calculated using the CKD EPI equation. This calculation has not been validated in all clinical situations. eGFR's persistently <60 mL/min signify possible Chronic Kidney Disease.    Anion gap 10 5 - 15  CBC WITH DIFFERENTIAL     Status: Abnormal   Collection Time: 04/11/16  1:20 AM  Result Value Ref Range   WBC 23.0 (H) 3.6 - 11.0 K/uL   RBC 3.80 3.80 - 5.20 MIL/uL   Hemoglobin 11.8 (L) 12.0 - 16.0 g/dL   HCT 33.8 (L) 35.0 - 47.0 %   MCV 88.9 80.0 - 100.0 fL   MCH 31.1 26.0 - 34.0 pg   MCHC 35.0 32.0 - 36.0 g/dL   RDW 13.4 11.5 - 14.5 %   Platelets 282 150 - 440 K/uL   Neutrophils Relative % 91 %   Neutro Abs 20.9 (H) 1.4 - 6.5 K/uL   Lymphocytes Relative 3 %   Lymphs Abs 0.8 (L) 1.0 - 3.6 K/uL   Monocytes Relative 6 %   Monocytes Absolute 1.3 (H) 0.2 - 0.9 K/uL   Eosinophils Relative 0 %   Eosinophils Absolute 0.0 0 - 0.7 K/uL   Basophils Relative 0 %   Basophils Absolute 0.0 0 - 0.1 K/uL  Blood Culture (routine x 2)     Status: None (Preliminary result)   Collection Time: 04/11/16  1:20 AM  Result Value Ref Range   Specimen Description BLOOD LEFT ARM    Special Requests      BOTTLES DRAWN AEROBIC AND ANAEROBIC 12CCAERO,11CCANA   Culture NO GROWTH 1 DAY    Report Status PENDING   Urinalysis, Routine w reflex microscopic     Status: Abnormal   Collection Time: 04/11/16  1:20 AM  Result Value Ref Range   Color, Urine YELLOW (A) YELLOW   APPearance CLEAR (A) CLEAR   Specific Gravity, Urine 1.013 1.005 - 1.030   pH 5.0 5.0 - 8.0   Glucose, UA NEGATIVE NEGATIVE mg/dL   Hgb urine dipstick SMALL (A) NEGATIVE   Bilirubin Urine NEGATIVE NEGATIVE   Ketones,  ur NEGATIVE NEGATIVE mg/dL   Protein, ur NEGATIVE NEGATIVE mg/dL   Nitrite NEGATIVE NEGATIVE   Leukocytes, UA NEGATIVE  NEGATIVE   RBC / HPF 0-5 0 - 5 RBC/hpf   WBC, UA 0-5 0 - 5 WBC/hpf   Bacteria, UA RARE (A) NONE SEEN   Squamous Epithelial / LPF 0-5 (A) NONE SEEN   Mucous PRESENT   Urine culture     Status: Abnormal   Collection Time: 04/11/16  1:20 AM  Result Value Ref Range   Specimen Description URINE, RANDOM    Special Requests NONE    Culture (A)     <10,000 COLONIES/mL INSIGNIFICANT GROWTH Performed at [facility]    Report Status 04/12/2016 FINAL   Troponin I     Status: None   Collection Time: 04/11/16  1:20 AM  Result Value Ref Range   Troponin I <0.03 <0.03 ng/mL  Procalcitonin     Status: None   Collection Time: 04/11/16  1:20 AM  Result Value Ref Range   Procalcitonin 0.35 ng/mL    Comment:        Interpretation: PCT (Procalcitonin) <= 0.5 ng/mL: Systemic infection (sepsis) is not likely. Local bacterial infection is possible. (NOTE)         ICU PCT Algorithm               Non ICU PCT Algorithm    ----------------------------     ------------------------------         PCT < 0.25 ng/mL                 PCT < 0.1 ng/mL     Stopping of antibiotics            Stopping of antibiotics       strongly encouraged.               strongly encouraged.    ----------------------------     ------------------------------       PCT level decrease by               PCT < 0.25 ng/mL       >= 80% from peak PCT       OR PCT 0.25 - 0.5 ng/mL          Stopping of antibiotics                                             encouraged.     Stopping of antibiotics           encouraged.    ----------------------------     ------------------------------       PCT level decrease by              PCT >= 0.25 ng/mL       < 80% from peak PCT        AND PCT >= 0.5 ng/mL            Continuin g antibiotics                                              encouraged.       Continuing antibiotics            encouraged.    ----------------------------     ------------------------------  PCT level increase  compared          PCT > 0.5 ng/mL         with peak PCT AND          PCT >= 0.5 ng/mL             Escalation of antibiotics                                          strongly encouraged.      Escalation of antibiotics        strongly encouraged.   Influenza panel by PCR (type A & B, H1N1)     Status: None   Collection Time: 04/11/16  1:20 AM  Result Value Ref Range   Influenza A By PCR NEGATIVE NEGATIVE   Influenza B By PCR NEGATIVE NEGATIVE    Comment: (NOTE) The Xpert Xpress Flu assay is intended as an aid in the diagnosis of  influenza and should not be used as a sole basis for treatment.  This  assay is FDA approved for nasopharyngeal swab specimens only. Nasal  washings and aspirates are unacceptable for Xpert Xpress Flu testing.   Blood Culture (routine x 2)     Status: None (Preliminary result)   Collection Time: 04/11/16  1:40 AM  Result Value Ref Range   Specimen Description BLOOD LEFT FOREARM    Special Requests      BOTTLES DRAWN AEROBIC AND ANAEROBIC [place]   Culture NO GROWTH 1 DAY    Report Status PENDING   Lactic acid, plasma     Status: None   Collection Time: 04/11/16  1:48 AM  Result Value Ref Range   Lactic Acid, Venous 1.7 0.5 - 1.9 mmol/L  Lactic acid, plasma     Status: Abnormal   Collection Time: 04/11/16  4:34 AM  Result Value Ref Range   Lactic Acid, Venous 2.6 (HH) 0.5 - 1.9 mmol/L    Comment: CRITICAL RESULT CALLED TO, READ BACK BY AND VERIFIED WITH KASEY VAUGHANSWARD AT 0515 ON 04/11/16 RWW   Lactic acid, plasma     Status: None   Collection Time: 04/11/16  7:19 AM  Result Value Ref Range   Lactic Acid, Venous 1.6 0.5 - 1.9 mmol/L  CBC     Status: Abnormal   Collection Time: 04/12/16  4:50 AM  Result Value Ref Range   WBC 11.4 (H) 3.6 - 11.0 K/uL   RBC 3.39 (L) 3.80 - 5.20 MIL/uL   Hemoglobin 10.3 (L) 12.0 - 16.0 g/dL   HCT 30.5 (L) 35.0 - 47.0 %   MCV 89.8 80.0 - 100.0 fL   MCH 30.5 26.0 - 34.0 pg   MCHC 33.9 32.0 - 36.0 g/dL    RDW 13.7 11.5 - 14.5 %   Platelets 216 150 - 440 K/uL   Dg Chest Port 1 View  Result Date: 04/11/2016 CLINICAL DATA:  Right breast cellulitis. EXAM: PORTABLE CHEST 1 VIEW COMPARISON:  None. FINDINGS: A single AP portable view of the chest demonstrates no focal airspace consolidation or alveolar edema. The lungs are grossly clear. There is no large effusion or pneumothorax. Cardiac and mediastinal contours appear unremarkable. IMPRESSION: No active disease. Electronically Signed   By: Andreas Newport M.D.   On: 04/11/2016 01:33   US Breast Ltd Uni Right Inc Axilla  Result Date: 04/12/2016 CLINICAL DATA:  65 year old  female with diffuse right breast pain, swelling and redness for 2 days. History of right breast cancer. EXAM: ULTRASOUND OF THE RIGHT BREAST COMPARISON:  None FINDINGS: On physical exam, diffuse right breast erythema noted. The right breast is firm. Targeted ultrasound is performed, showing right breast skin edema without focal collection or abscess. No suspicious mass, distortion or abnormal shadowing identified. IMPRESSION: Right breast mastitis.  No evidence of abscess or suspicious mass. RECOMMENDATION: Clinical followup. If symptoms do not improve, consider diagnostic mammogram. Bilateral screening mammograms in March 2018 to resume annual mammogram schedule. I have discussed the findings and recommendations with the patient. Results were also provided in writing at the conclusion of the visit. If applicable, a reminder letter will be sent to the patient regarding the next appointment. BI-RADS CATEGORY  2: Benign. Electronically Signed   By: Margarette Canada M.D.   On: 04/12/2016 12:50    Review of Systems  Constitutional: Positive for chills, diaphoresis, fever and malaise/fatigue. Negative for weight loss.  HENT: Negative for congestion, sinus pain and sore throat.   Respiratory: Negative for cough, shortness of breath and wheezing.   Cardiovascular: Negative for chest pain,  claudication and leg swelling.  Gastrointestinal: Negative for abdominal pain, constipation, diarrhea, nausea and vomiting.  Genitourinary: Negative for dysuria, frequency, hematuria and urgency.  Musculoskeletal: Negative for back pain, joint pain and neck pain.  Skin: Positive for itching and rash.  Neurological: Positive for weakness. Negative for dizziness and focal weakness.  Psychiatric/Behavioral: Negative for depression. The patient is not nervous/anxious.   All other systems reviewed and are negative.   Blood pressure (!) 141/71, pulse 89, temperature 98.7 F (37.1 C), temperature source Oral, resp. rate 16, height 5' 4"  (1.626 m), weight 193 lb 3.2 oz (87.6 kg), SpO2 91 %. Physical Exam  Vitals reviewed. Constitutional: She is oriented to person, place, and time. She appears well-developed and well-nourished. No distress.  HENT:  Head: Normocephalic and atraumatic.  Right Ear: External ear normal.  Left Ear: External ear normal.  Nose: Nose normal.  Mouth/Throat: Oropharynx is clear and moist. No oropharyngeal exudate.  Eyes: Conjunctivae and EOM are normal. Pupils are equal, round, and reactive to light. No scleral icterus.  Neck: Normal range of motion. Neck supple. No tracheal deviation present.  Cardiovascular: Normal rate, regular rhythm, normal heart sounds and intact distal pulses.  Exam reveals no gallop and no friction rub.   No murmur heard. Respiratory: Breath sounds normal. No respiratory distress. She has no wheezes. She has no rales. She exhibits tenderness.  Left breast normal skin no issues  Right breast with erythema induration and cellulitis covering the entirety of the margins of breast tissue and out laterally toward the axilla and around part of the chest wall, this covers an area of approximately 25 x 15 cm. There is a well-healing scar with some dimpling around the incision site. The patient does not have a discrete mass in the breast but does have an area  of induration in the lateral portion which measures approximately 4 x 3 cm. There is no punctate head or any evidence of a abscess punctum.  GI: Soft. Bowel sounds are normal. She exhibits no distension. There is no tenderness. There is no rebound and no guarding.  Musculoskeletal: Normal range of motion. She exhibits no edema, tenderness or deformity.  Neurological: She is alert and oriented to person, place, and time. No cranial nerve deficit.  Skin: Skin is warm and dry. Rash noted. There is erythema.  Psychiatric: She has a normal mood and affect. Her behavior is normal. Judgment and thought content normal.     Assessment/Plan 65 year old female with right breast cellulitis and a history of right breast cancer. I reviewed her past medical history where she is had well-controlled hypertension hyperlipidemia and hypothyroidism. I also reviewed her history of breast cancer as well as the treatments given the last time she had this inflammation in the breasts. I have personally reviewed her laboratory values which are significant for a high white blood cell count of 23,000 which is now down to 11.  I also personally reviewed her ultrasound images which do not show any discrete abscess but do show some edema within the wall. I have also reviewed the radiology reads as above.  I discussed with her that at this time I do not see a discrete abscess that needs drainage however there is some concern given her past history that this potentially could be inflammatory breast cancer as well. She has seemed to have some decrease in the cellulitis in area since starting the antibiotics. I will put her on high-dose steroids since this worked in the past to see if we can get some decrease in the cellulitis. I will evaluate her tomorrow morning and will do my own ultrasound along the area of induration to see if an abscess has now formed. If things are not significantly better and abscess appears to be forming then I  will take her to the OR tomorrow for debridement and get a skin sample at that time. The patient is in agreement with this plan as stated and I will make her nothing by mouth after midnight just in case we need to go to the operating room tomorrow.       Hubbard Robinson, MD 04/12/2016, 6:51 PM

## 2016-04-13 LAB — CBC
HEMATOCRIT: 29.9 % — AB (ref 35.0–47.0)
HEMOGLOBIN: 10.3 g/dL — AB (ref 12.0–16.0)
MCH: 30.9 pg (ref 26.0–34.0)
MCHC: 34.5 g/dL (ref 32.0–36.0)
MCV: 89.5 fL (ref 80.0–100.0)
Platelets: 196 10*3/uL (ref 150–440)
RBC: 3.35 MIL/uL — ABNORMAL LOW (ref 3.80–5.20)
RDW: 13.5 % (ref 11.5–14.5)
WBC: 9.8 10*3/uL (ref 3.6–11.0)

## 2016-04-13 MED ORDER — NORTRIPTYLINE HCL 25 MG PO CAPS
25.0000 mg | ORAL_CAPSULE | Freq: Every day | ORAL | Status: DC
  Administered 2016-04-13 – 2016-04-14 (×2): 25 mg via ORAL
  Filled 2016-04-13 (×2): qty 1

## 2016-04-13 MED ORDER — PREDNISONE 5 MG PO TABS
50.0000 mg | ORAL_TABLET | Freq: Every day | ORAL | Status: DC
  Filled 2016-04-13: qty 2

## 2016-04-13 NOTE — Progress Notes (Signed)
[facility] [place] NAME: Eua Lunt    MR#:  [id number]  DATE OF BIRTH:  December 18, 1950  SUBJECTIVE:seen at bedside.her breast cellulitis is better.started on prednisoneto help with breast edema..  CHIEF COMPLAINT:   Chief Complaint  Patient presents with  . Breast Pain    History of Breast CA. Has swelling tonight  . Generalized Body Aches  . Chills  . Fever    REVIEW OF SYSTEMS:   ROS CONSTITUTIONAL: No fever, fatigue or weakness.  EYES: No blurred or double vision.  EARS, NOSE, AND THROAT: No tinnitus or ear pain.  RESPIRATORY: No cough, shortness of breath, wheezing or hemoptysis.  CARDIOVASCULAR: No chest pain, orthopnea, edema.  GASTROINTESTINAL: No nausea, vomiting, diarrhea or abdominal pain.  GENITOURINARY: No dysuria, hematuria.  ENDOCRINE: No polyuria, nocturia,  HEMATOLOGY: No anemia, easy bruising or bleeding SKIN: right breast erythema,rednessextending to right lateral chest wall,. MUSCULOSKELETAL: No joint pain or arthritis.   NEUROLOGIC: No tingling, numbness, weakness.  PSYCHIATRY: No anxiety or depression.   DRUG ALLERGIES:   Allergies  Allergen Reactions  . Valium [Diazepam] Other (See Comments)  . Cephalexin Nausea Only  . Ciprofloxacin Diarrhea and Nausea Only    VITALS:  Blood pressure 137/75, pulse 89, temperature 98.9 F (37.2 C), temperature source Oral, resp. rate 17, height 5\' 4"  (1.626 m), weight 87.6 kg (193 lb 3.2 oz), SpO2 96 %.  PHYSICAL EXAMINATION:  GENERAL:  65 y.o.-year-old patient lying in the bed with no acute distress.  EYES: Pupils equal, round, reactive to light and accommodation. No scleral icterus. Extraocular muscles intact.  HEENT: Head atraumatic, normocephalic. Oropharynx and nasopharynx clear.  NECK:  Supple, no jugular venous distention. No thyroid enlargement, no tenderness.  LUNGS: Normal breath sounds bilaterally, no wheezing, rales,rhonchi or crepitation. No use  of accessory muscles of respiration.  CARDIOVASCULAR: S1, S2 normal. No murmurs, rubs, or gallops.  ABDOMEN: Soft, nontender, nondistended. Bowel sounds present. No organomegaly or mass.  EXTREMITIES: No pedal edema, cyanosis, or clubbing.  NEUROLOGIC: Cranial nerves II through XII are intact. Muscle strength 5/5 in all extremities. Sensation intact. Gait not checked.  PSYCHIATRIC: The patient is alert and oriented x 3.  SKIN: right breast erythema ,swelling and some induration but better than yesterday.   LABORATORY PANEL:   CBC  Recent Labs Lab 04/13/16 1024  WBC 9.8  HGB 10.3*  HCT 29.9*  PLT 196   ------------------------------------------------------------------------------------------------------------------  Chemistries   Recent Labs Lab 04/11/16 0120  NA 135  K 3.5  CL 95*  CO2 30  GLUCOSE 120*  BUN 13  CREATININE 0.85  CALCIUM 9.8  AST 38  ALT 25  ALKPHOS 68  BILITOT 1.1   ------------------------------------------------------------------------------------------------------------------  Cardiac Enzymes  Recent Labs Lab 04/11/16 0120  TROPONINI <0.03   ------------------------------------------------------------------------------------------------------------------  RADIOLOGY:  US Breast Ltd Uni Right Inc Axilla  Result Date: 04/12/2016 CLINICAL DATA:  65 year old female with diffuse right breast pain, swelling and redness for 2 days. History of right breast cancer. EXAM: ULTRASOUND OF THE RIGHT BREAST COMPARISON:  None FINDINGS: On physical exam, diffuse right breast erythema noted. The right breast is firm. Targeted ultrasound is performed, showing right breast skin edema without focal collection or abscess. No suspicious mass, distortion or abnormal shadowing identified. IMPRESSION: Right breast mastitis.  No evidence of abscess or suspicious mass. RECOMMENDATION: Clinical followup. If symptoms do not improve, consider diagnostic mammogram. Bilateral  screening mammograms in March 2018 to resume annual mammogram schedule. I  have discussed the findings and recommendations with the patient. Results were also provided in writing at the conclusion of the visit. If applicable, a reminder letter will be sent to the patient regarding the next appointment. BI-RADS CATEGORY  2: Benign. Electronically Signed   By: Margarette Canada M.D.   On: 04/12/2016 12:50    EKG:  No orders found for this or any previous visit.  ASSESSMENT AND PLAN:  1.right breast mastitis.no evidence of abcess;seen by surgery,oncology;no evidence of inflammatory breast cancer.on iv clindamycin.started on prednisone. Clinically better.continue present treatment,d/w DR.Loflin from surgery.check ultrasound of right breast tomorrow also,continue cold compression. 2,.spinal stenosis   All the records are reviewed and case discussed with Care Management/Social Workerr. Management plans discussed with the patient, family and they are in agreement.  CODE STATUS:full  TOTAL TIME TAKING CARE OF THIS PATIENT:35 min minutes.   POSSIBLE D/C IN 1-2 DAYS, DEPENDING ON CLINICAL CONDITION.   Epifanio Lesches M.D on 04/13/2016 at 7:39 PM  Between 7am to 6pm - Pager - [phone]  After 6pm go to [email or web address] - password [email or web address] [facility]  Office  [phone]  CC: Primary care physician; Adin Hector, MD   Note: This dictation was prepared with Dragon dictation along with smaller phrase technology. Any transcriptional errors that result from this process are unintentional.

## 2016-04-13 NOTE — Consult Note (Addendum)
[facility]  Date of admission:  04/11/2016  Inpatient day:  04/12/2016   Consulting physician: Dr. Epifanio Lesches  Reason for Consultation:  Right breast cellulitis versus inflammatory breast cancer.  Chief Complaint: Veronica Cisneros is a 65 y.o. female with a history of right breast cancer who was admitted with cellulitis.  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  Twenty three lymph nodes were negative.  Tumor was ER and PR negative. HER-2/neu was not assessed. She received adjuvant Adriamycin and Cytoxan [facility]) under the direction of [facility] [place].  She received radiation.  She has had no evidence of recurrent disease in the past 20 years.  She is followed in the [facility].  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 She has treated with this with Nystatin powder and cream.  She notes some cracking and opening of her skin beneath her breast.  Approximately 4 years ago while traveling to [place], she developed right breast cellulitis. She was treated with a prednisone taper for about a week.  She was in her usual state of good health until Saturday, 04/10/2016. She first noted some phantom pain in her right breast and then a hard pain.  She was very active decorating for Christmas. At 4 PM, she developed headache, neck ache, fever, and rigors. Her right breast became red.  She was brought to the emergency room.  She was empirically started on vancomycin and Zosyn for cellulitis of the right breast.  Initial CBC revealed a white count of 23,000.  The area of erythema was marked.  Fever has resolved.  The line of erythema has retracted.  WBC today is 11,400.  She feels dramatically better.  Right breast ultrasound on 04/12/2016 revealed right breast mastitis. There was no evidence of abscess or suspicious mass.   Past Medical History:   Diagnosis Date  . Blood transfusion without reported diagnosis   . breast cancer   . Bursitis   . Colon polyps   . High cholesterol   . Hypertension   . IBS (irritable bowel syndrome)   . Spinal stenosis     Past Surgical History:  Procedure Laterality Date  . ABDOMINAL HYSTERECTOMY    . BREAST SURGERY    . colonscopy    . FL INJ RIGHT KNEE MR ARTHROGRAM ([facility] HX)    . LYMPH GLAND EXCISION    . SPINE SURGERY      Family History  Problem Relation Age of Onset  . Hypertension Mother     Social History:  reports that she has never smoked. She has never used smokeless tobacco. She reports that she drinks about 12.6 oz of alcohol per week . She reports that she does not use drugs.  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  She is alone today.  Allergies:  Allergies  Allergen Reactions  . Valium [Diazepam] Other (See Comments)  . Cephalexin Nausea Only  . Ciprofloxacin Diarrhea and Nausea Only    Medications Prior to Admission  Medication Sig Dispense Refill  . acetaminophen (TYLENOL) 650 MG CR tablet Take 1,300 mg by mouth 2 (two) times daily.     Marland Kitchen ascorbic acid (VITAMIN C) 100 MG tablet Take 100 mg by mouth daily.    Marland Kitchen aspirin EC 81 MG  tablet Take 81 mg by mouth.    Marland Kitchen atenolol (TENORMIN) 25 MG tablet Take 25 mg by mouth daily.   2  . Cholecalciferol (VITAMIN D3) 1000 units CAPS Take by mouth.    . folic acid (FOLVITE) 1 MG tablet Take 1 mg by mouth.    . gabapentin (NEURONTIN) 300 MG capsule 1 po bid    . meloxicam (MOBIC) 15 MG tablet Take 15 mg by mouth.    . nortriptyline (PAMELOR) 25 MG capsule Take 25 mg by mouth at bedtime.     Marland Kitchen nystatin (NYSTATIN) powder Apply 1 g topically 4 (four) times daily.    Marland Kitchen nystatin cream (MYCOSTATIN) Apply topically.    . pantoprazole (PROTONIX) 40 MG tablet Take 40 mg by mouth daily.     Marland Kitchen RA KRILL OIL 500 MG CAPS Take 1 capsule by mouth  daily.     . rosuvastatin (CRESTOR) 5 MG tablet TAKE ONE TABLET AT BEDTIME    . telmisartan-hydrochlorothiazide (MICARDIS HCT) 40-12.5 MG tablet   2    Review of Systems: GENERAL:  Feels much better.  Fever and chills resolved.  No weight loss. PERFORMANCE STATUS (ECOG):  1 HEENT:  No visual changes, runny nose, sore throat, mouth sores or tenderness. Lungs: No shortness of breath or cough.  No hemoptysis. Cardiac:  No chest pain, palpitations, orthopnea, or PND. GI:  No nausea, vomiting, diarrhea, constipation, melena or hematochezia. GU:  No urgency, frequency, dysuria, or hematuria. Musculoskeletal:  No back pain.  No joint pain.  No muscle tenderness. Extremities:  No pain or swelling. Skin:  Right breast tender and red, improving. Neuro:  No headache, numbness or weakness, balance or coordination issues. Endocrine:  No diabetes, thyroid issues, hot flashes or night sweats. Psych:  No mood changes, depression or anxiety. Pain:  No focal pain. Review of systems:  All other systems reviewed and found to be negative.  Physical Exam:  Blood pressure 138/68, pulse (!) 106, temperature 98.8 F (37.1 C), temperature source Oral, resp. rate 17, height 5' 4"  (1.626 m), weight 193 lb 3.2 oz (87.6 kg), SpO2 95 %.  GENERAL:  Well developed, well nourished, woman sitting comfortably on the medical unit in no acute distress. MENTAL STATUS:  Alert and oriented to person, place and time. HEAD:  Pearline Cables hair.  Normocephalic, atraumatic, face symmetric, no Cushingoid features. EYES:  Glasses.  Blue eyes.  Pupils equal round and reactive to light and accomodation.  No conjunctivitis or scleral icterus. ENT:  Oropharynx clear without lesion.  Tongue normal. Mucous membranes moist.  RESPIRATORY:  Clear to auscultation without rales, wheezes or rhonchi. CARDIOVASCULAR:  Regular rate and rhythm without murmur, rub or gallop. BREAST:  Right breast edematous and firm.  Marked erythema with increased warmth.   Dimpling laterally.  Erythema extends laterally under axillae.  Erythema has retracted from original outline. No masses or nipple discharge. ABDOMEN:  Soft, non-tender, with active bowel sounds, and no hepatosplenomegaly.  No masses. SKIN:  No rashes, ulcers or lesions. EXTREMITIES: ICDs in place.  No edema, no skin discoloration or tenderness.  No palpable cords. LYMPH NODES: No palpable cervical, supraclavicular, axillary or inguinal adenopathy  NEUROLOGICAL: Unremarkable. PSYCH:  Appropriate.   Results for orders placed or performed during the hospital encounter of 04/11/16 (from the past 48 hour(s))  Lactic acid, plasma     Status: None   Collection Time: 04/11/16  7:19 AM  Result Value Ref Range   Lactic Acid, Venous 1.6 0.5 - 1.9  mmol/L  CBC     Status: Abnormal   Collection Time: 04/12/16  4:50 AM  Result Value Ref Range   WBC 11.4 (H) 3.6 - 11.0 K/uL   RBC 3.39 (L) 3.80 - 5.20 MIL/uL   Hemoglobin 10.3 (L) 12.0 - 16.0 g/dL   HCT 30.5 (L) 35.0 - 47.0 %   MCV 89.8 80.0 - 100.0 fL   MCH 30.5 26.0 - 34.0 pg   MCHC 33.9 32.0 - 36.0 g/dL   RDW 13.7 11.5 - 14.5 %   Platelets 216 150 - 440 K/uL   US Breast Ltd Uni Right Inc Axilla  Result Date: 04/12/2016 CLINICAL DATA:  65 year old female with diffuse right breast pain, swelling and redness for 2 days. History of right breast cancer. EXAM: ULTRASOUND OF THE RIGHT BREAST COMPARISON:  None FINDINGS: On physical exam, diffuse right breast erythema noted. The right breast is firm. Targeted ultrasound is performed, showing right breast skin edema without focal collection or abscess. No suspicious mass, distortion or abnormal shadowing identified. IMPRESSION: Right breast mastitis.  No evidence of abscess or suspicious mass. RECOMMENDATION: Clinical followup. If symptoms do not improve, consider diagnostic mammogram. Bilateral screening mammograms in March 2018 to resume annual mammogram schedule. I have discussed the findings and  recommendations with the patient. Results were also provided in writing at the conclusion of the visit. If applicable, a reminder letter will be sent to the patient regarding the next appointment. BI-RADS CATEGORY  2: Benign. Electronically Signed   By: Margarette Canada M.D.   On: 04/12/2016 12:50    Assessment:  The patient is a 65 y.o. woman with a history of stage II right breast cancer s/p lumpectomy and radiation in 1997.  She has a history of right breast cellulitis 4 years ago.  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  She was admitted with recurrent right breast cellulitis.  She is on broad spectrum antibiotics with resolution of fever, decreased white blood cell count, and improving edge of erythema.  Ultrasound reveals no abscess.  Plan:   1.  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  She presented with a rapid change in breast (normal to dense erythema and induration), fever, chills, and elevated WBC now improving on antibiotics.  No evidence of inflammatory breast cancer.    Risk factors for her recurrent cellulitis include her prior treatment for breast cancer, prior breast cellulitis, and recurrent skin breakdown.  Continue current antibiotics with plan to switch to oral antibiotics in the outpatient department.  Additional treatment to include nonsteroidal anti-inflammatory agents and cold compresses to reduce local pain and swelling.  Thank you for allowing me to participate in Veronica Cisneros 's care.  I will follow her closely with you while hospitalized and after discharge in the outpatient department.   Lequita Asal, MD  04/12/2016

## 2016-04-13 NOTE — Progress Notes (Signed)
65 year old female with a history of right breast lumpectomy and axillary node dissection for breast cancer with radiation and chemotherapy 20 years prior comes in with a red inflamed cellulitic breast which happen 3 years prior treated by prednisone taper. The patient states that she did have increased pain in the right breast last night but thinks it's because she was up and moving around too much yesterday. The patient also had a fever up to the 100.1 overnight as well. The patient overall feels better than when she previously came in the not a significant change from last night to today. She does not currently have a fever.  Vitals:   04/13/16 0157 04/13/16 0425  BP: (!) 153/73 138/68  Pulse: (!) 113 (!) 106  Resp: 18 17  Temp: 98.9 F (37.2 C) 98.8 F (37.1 C)   I/O last 3 completed shifts: In: 2807.5 [P.O.:720; I.V.:1637.5; IV Piggyback:450] Out: -  No intake/output data recorded.   PE:  Gen: NAD Res: CTAB/L  Cardio: RRR, no tachycardia at this time Right breast:  Erythema, induration and cellulitis covering the entire breast, dimpling slightly improved in the medial portion this AM but erythema extending to 1cm from previously marked margins on the breast and extending onto lateral chest wall, covering a total area of about 25 x 15cm.  There is a markedly more tender and indurated area just lateral to the nipple of about 4cm.  I ultrasounded the breast at bedside today with some fluid collection in this area about 2cm deep to thickened skin tissue but no discrete abscess formation.   GI: soft, non tender Ext: no edema  CBC Latest Ref Rng & Units 04/12/2016 04/11/2016 11/04/2015  WBC 3.6 - 11.0 K/uL 11.4(H) 23.0(H) 10.8  Hemoglobin 12.0 - 16.0 g/dL 10.3(L) 11.8(L) 12.5  Hematocrit 35.0 - 47.0 % 30.5(L) 33.8(L) 34.9(L)  Platelets 150 - 440 K/uL 216 282 295   CMP Latest Ref Rng & Units 04/11/2016 11/04/2015  Glucose 65 - 99 mg/dL 120(H) 112(H)  BUN 6 - 20 mg/dL 13 13  Creatinine  0.44 - 1.00 mg/dL 0.85 0.78  Sodium 135 - 145 mmol/L 135 130(L)  Potassium 3.5 - 5.1 mmol/L 3.5 2.6(LL)  Chloride 101 - 111 mmol/L 95(L) 89(L)  CO2 22 - 32 mmol/L 30 32  Calcium 8.9 - 10.3 mg/dL 9.8 9.8  Total Protein 6.5 - 8.1 g/dL 7.3 7.5  Total Bilirubin 0.3 - 1.2 mg/dL 1.1 0.7  Alkaline Phos 38 - 126 U/L 68 61  AST 15 - 41 U/L 38 77(H)  ALT 14 - 54 U/L 25 30   A/P:  65 year old female with right breast cellulitis with a history of right breast cancer and whole breast radiation. I discussed with the patient that she did have steroid taper done at [facility] 3 years prior and would like to attempt this treatment here as well. In very rare cases particularly with whole breast radiation and without having adequate lymph node drainage, if there are breaks in the skin, it can cause a significant inflammatory reaction over the area of radiated skin. This does seem to be in line with what we are seening here especially without having a discrete abscess as well as would correlate with why steroid taper causing improvement previously.    I have discussed this with Dr. Grayland Ormond as the patient's regular oncologist Dr. Mike Gip is not available today and he does have one patient that is very similar and improves with steroids as well. I would continue the  antibiotics, ice packs and will try the steroid taper today. Additionally I will continue to evaluate and likely do another ultrasound again tomorrow to ensure that there is not an abscess forming in this lateral portion.  I do not feel that there is any need to drain the area at this time. However, I did discuss the potential of a skin biopsy to rule out inflammatory breast cancer and to test for specific inflammatory markers with Dr. Grayland Ormond. I will discuss this with the patient again tomorrow but for now the patient wishes not to have any surgical interventions unless absolutely necessary and I am in agreement.  The patient's fever as well I have  ordered a CBC today. I have also called and discussed this with the patient's medicine Dr. Vianne Bulls as well.

## 2016-04-14 MED ORDER — SALINE SPRAY 0.65 % NA SOLN
1.0000 | NASAL | Status: DC | PRN
  Filled 2016-04-14: qty 44

## 2016-04-14 MED ORDER — PREDNISONE 5 MG PO TABS
25.0000 mg | ORAL_TABLET | Freq: Two times a day (BID) | ORAL | Status: DC
  Administered 2016-04-14 – 2016-04-15 (×3): 25 mg via ORAL
  Filled 2016-04-14: qty 1

## 2016-04-14 MED ORDER — FLUCONAZOLE 50 MG PO TABS
150.0000 mg | ORAL_TABLET | Freq: Every day | ORAL | Status: AC
  Administered 2016-04-14 – 2016-04-15 (×2): 150 mg via ORAL
  Filled 2016-04-14 (×2): qty 3
  Filled 2016-04-14: qty 1

## 2016-04-14 NOTE — Progress Notes (Signed)
[facility] [place] NAME: Veronica Cisneros    MR#:  [id number]  DATE OF BIRTH:  03/18/1951  SUBJECTIVE: Decreased redness, swelling of the right breast. Prednisone caused confusion overnight due to prednisone.   CHIEF COMPLAINT:   Chief Complaint  Patient presents with  . Breast Pain    History of Breast CA. Has swelling tonight  . Generalized Body Aches  . Chills  . Fever    REVIEW OF SYSTEMS:   ROS CONSTITUTIONAL: No fever, fatigue or weakness.  EYES: No blurred or double vision.  EARS, NOSE, AND THROAT: No tinnitus or ear pain.  RESPIRATORY: No cough, shortness of breath, wheezing or hemoptysis.  CARDIOVASCULAR: No chest pain, orthopnea, edema.  GASTROINTESTINAL: No nausea, vomiting, diarrhea or abdominal pain.  GENITOURINARY: No dysuria, hematuria.  ENDOCRINE: No polyuria, nocturia,  HEMATOLOGY: No anemia, easy bruising or bleeding SKIN: right breast erythema,rednessextending to right lateral chest wall,. MUSCULOSKELETAL: No joint pain or arthritis.   NEUROLOGIC: No tingling, numbness, weakness.  PSYCHIATRY: No anxiety or depression.   DRUG ALLERGIES:   Allergies  Allergen Reactions  . Valium [Diazepam] Other (See Comments)  . Cephalexin Nausea Only  . Ciprofloxacin Diarrhea and Nausea Only    VITALS:  Blood pressure 139/72, pulse 78, temperature 98.3 F (36.8 C), temperature source Oral, resp. rate 18, height 5\' 4"  (1.626 m), weight 87.6 kg (193 lb 3.2 oz), SpO2 97 %.  PHYSICAL EXAMINATION:  GENERAL:  65 y.o.-year-old patient lying in the bed with no acute distress.  EYES: Pupils equal, round, reactive to light and accommodation. No scleral icterus. Extraocular muscles intact.  HEENT: Head atraumatic, normocephalic. Oropharynx and nasopharynx clear.  NECK:  Supple, no jugular venous distention. No thyroid enlargement, no tenderness.  LUNGS: Normal breath sounds bilaterally, no wheezing, rales,rhonchi or  crepitation. No use of accessory muscles of respiration.  CARDIOVASCULAR: S1, S2 normal. No murmurs, rubs, or gallops.  ABDOMEN: Soft, nontender, nondistended. Bowel sounds present. No organomegaly or mass.  EXTREMITIES: No pedal edema, cyanosis, or clubbing.  NEUROLOGIC: Cranial nerves II through XII are intact. Muscle strength 5/5 in all extremities. Sensation intact. Gait not checked.  PSYCHIATRIC: The patient is alert and oriented x 3.  SKIN: right breast erythema ,swelling and some induration but better than yesterday.   LABORATORY PANEL:   CBC  Recent Labs Lab 04/13/16 1024  WBC 9.8  HGB 10.3*  HCT 29.9*  PLT 196   ------------------------------------------------------------------------------------------------------------------  Chemistries   Recent Labs Lab 04/11/16 0120  NA 135  K 3.5  CL 95*  CO2 30  GLUCOSE 120*  BUN 13  CREATININE 0.85  CALCIUM 9.8  AST 38  ALT 25  ALKPHOS 68  BILITOT 1.1   ------------------------------------------------------------------------------------------------------------------  Cardiac Enzymes  Recent Labs Lab 04/11/16 0120  TROPONINI <0.03   ------------------------------------------------------------------------------------------------------------------  RADIOLOGY:  No results found.  EKG:  No orders found for this or any previous visit.  ASSESSMENT AND PLAN:  1.right breast mastitis.no evidence of abcess;seen by surgery,oncology;no evidence of inflammatory breast cancer.on iv clindamycin.started on prednisone.clinically improving slowly/ Confusion due to prednisone: Adjusted the dose. Try to taper it off from tomorrow.Clinically better.continue present treatment,d/w DR.Loflin from surgery. Continue cold compressions. Likely discharge tomorrow.  2,.spinal stenosis 1. Essential hypertension: Controlled. Continue atenolol, irbesartan,hctz.  All the records are reviewed and case discussed with Care Management/Social  Workerr. Management plans discussed with the patient, family and they are in agreement.  CODE STATUS:full  TOTAL TIME TAKING CARE OF  THIS PATIENT:35 min minutes.   POSSIBLE D/C IN 1-2 DAYS, DEPENDING ON CLINICAL CONDITION.   Epifanio Lesches M.D on 04/14/2016 at 4:18 PM  Between 7am to 6pm - Pager - [phone]  After 6pm go to [email or web address] - password [email or web address] [facility]  Office  [phone]  CC: Primary care physician; Adin Hector, MD   Note: This dictation was prepared with Dragon dictation along with smaller phrase technology. Any transcriptional errors that result from this process are unintentional.

## 2016-04-14 NOTE — Progress Notes (Signed)
Pt has had 2 episodes of confusion over the last two nights.  1st night 12/12, she came out of her room because she thought she heard her husband.  2nd night, 12/13, she had a very vivid hallucination/thought process that left her tearful and anxious.  Pt will address with the MD this am.

## 2016-04-14 NOTE — Progress Notes (Signed)
65 year old female with a history of right breast lumpectomy and axillary node dissection for breast cancer with radiation and chemotherapy 20 years prior comes in with a red inflamed cellulitic breast.  After trying the steroids the patient did have some episodes of confusion. The patient does state that there is a drastic difference of the breast is no longer a swollen painful and that the redness has receded dramatically. And also states that she may be getting a yeast infection.  Vitals:   04/14/16 0847 04/14/16 1241  BP: (!) 155/76 139/72  Pulse: 87 78  Resp:  18  Temp:  98.3 F (36.8 C)   I/O last 3 completed shifts: In: 2443.8 [P.O.:600; I.V.:1743.8; IV Piggyback:100] Out: -  No intake/output data recorded.   PE:  Gen: NAD Res: CTAB/L  Cardio: RRR, no tachycardia at this time Right breast: The erythema that previously was overlying the entirety of the breasts is both receded from the drawn areas and instead of being bright red is now very dull pink color just a slightly darker color than the surrounding skin.  She does still have a slight area of thickened skin in the lateral portion however the induration has dramatically resolved and is almost completely gone away as well as the cellulitis. There is no further skin changes onto the chest wall. GI: soft, non tender Ext: no edema  CBC Latest Ref Rng & Units 04/13/2016 04/12/2016 04/11/2016  WBC 3.6 - 11.0 K/uL 9.8 11.4(H) 23.0(H)  Hemoglobin 12.0 - 16.0 g/dL 10.3(L) 10.3(L) 11.8(L)  Hematocrit 35.0 - 47.0 % 29.9(L) 30.5(L) 33.8(L)  Platelets 150 - 440 K/uL 196 216 282   CMP Latest Ref Rng & Units 04/11/2016 11/04/2015  Glucose 65 - 99 mg/dL 120(H) 112(H)  BUN 6 - 20 mg/dL 13 13  Creatinine 0.44 - 1.00 mg/dL 0.85 0.78  Sodium 135 - 145 mmol/L 135 130(L)  Potassium 3.5 - 5.1 mmol/L 3.5 2.6(LL)  Chloride 101 - 111 mmol/L 95(L) 89(L)  CO2 22 - 32 mmol/L 30 32  Calcium 8.9 - 10.3 mg/dL 9.8 9.8  Total Protein 6.5 - 8.1 g/dL 7.3  7.5  Total Bilirubin 0.3 - 1.2 mg/dL 1.1 0.7  Alkaline Phos 38 - 126 U/L 68 61  AST 15 - 41 U/L 38 77(H)  ALT 14 - 54 U/L 25 63   A/P:  65 year old female with right breast cellulitis with a history of right breast cancer and whole breast radiation.  The patient has dramatically improved with steroids and she has begun a steroid taper from 50 twice a day to 25 twice a day today I would do a slower taper at home to ensure that this does not flare back up. With her concerns of a yeast infection as well as the Candida rash in the inframammary fold I will give her a dose of Diflucan today and another one tomorrow. I did discuss with the patient it would be optimal to resolve this with a steroid taper and not have to have her on maintenance steroids. The patient also discussed having a prescription for prednisone in the case of this begins to flare again and I think that is likely a reasonable plan. Given the dramatic change I do feel like this is a drastic inflammatory response over the radiated skin after having some break in the skin from the rash that she had. If we can continue to keep this rash under control she hopefully will have any previous layers.  I also agree with transitioning  to clindamycin by mouth for a total of 2 weeks. I also will to her a follow-up appointment in my office for next week to ensure she is continuing to improve.

## 2016-04-15 LAB — CREATININE, SERUM
Creatinine, Ser: 0.81 mg/dL (ref 0.44–1.00)
GFR calc Af Amer: >60 mL/min (ref >60)
GFR calc non Af Amer: >60 mL/min (ref >60)

## 2016-04-15 MED ORDER — PREDNISOLONE 5 MG PO TABS: 5.0000 mg | ORAL_TABLET | Freq: Every day | ORAL | 0 refills | Status: DC

## 2016-04-15 MED ORDER — CLINDAMYCIN HCL 300 MG PO CAPS: 300.0000 mg | ORAL_CAPSULE | Freq: Three times a day (TID) | ORAL | 0 refills | Status: DC

## 2016-04-15 NOTE — Plan of Care (Signed)
Problem: Education: Goal: Knowledge of [facility] Education information/materials will improve Outcome: Progressing VS WDL, free of falls during shift.  No issues overnight, denies pain, nausea.  Husband at bedside, call bell within reach.  WCTM.

## 2016-04-15 NOTE — Discharge Summary (Signed)
[facility] [place] NAME: Veronica Cisneros    MR#:  [id number]  DATE OF BIRTH:  [birth year]  DATE OF ADMISSION:  04/11/2016 ADMITTING PHYSICIAN: Saundra Shelling, MD  DATE OF DISCHARGE: 04/15/16 PRIMARY CARE PHYSICIAN: Tama High III, MD    ADMISSION DIAGNOSIS:  Cellulitis of chest wall [L03.313] Sepsis, due to unspecified organism ([place]) [A41.9]  DISCHARGE DIAGNOSIS:  Principal Problem:   Cellulitis Active Problems:   Cellulitis of right breast   SECONDARY DIAGNOSIS:   Past Medical History:  Diagnosis Date  . Blood transfusion without reported diagnosis   . breast cancer   . Bursitis   . Colon polyps   . High cholesterol   . Hypertension   . IBS (irritable bowel syndrome)   . Spinal stenosis     HOSPITAL COURSE:   HISTORY OF PRESENT ILLNESS: Veronica Cisneros  is a 65 y.o. female with a known history of Right breast cancer, hyperlipidemia, hypertension, irritable bowel syndrome, spinal stenosis presented to the emergency room with swelling, tenderness and redness of the right breast since yesterday afternoon. Patient noticed all of a sudden yesterday afternoon increased tenderness in the right breast with the redness of the skin or the breast which was extending to the back of the chest wall on the right side. No history of any trauma to the right side of the chest. Patient has breast cancer since last 20 years and she had one similar complaints in the past. Had low-grade fever of 100.67F. No complaints of any nausea or vomiting. Had chills and chest wall tenderness. The chest wall tenderness is aching in nature 5 out of 10 on a scale of 1-10. Patient was evaluated in the emergency room was given IV antibiotics vancomycin and Zosyn for cellulitis of the chest wall and code sepsis was called. Patient had fever low-grade and tachycardia and elevated WBC count. Please review h&p for details  Hospital course   1.right breast mastitis.no  evidence of abcess;seen by surgery,oncology;no evidence of inflammatory breast cancer.on iv clindamycin.started on prednisone.clinically improving  Will discharge patient home on prednisone taper for a week and by mouth clindamycin for a total of 14 days Confusion due to prednisone: Adjusted the dose. Patient clinically improved  Continue cold compressions as needed  Appreciate Dr. Erven Colla recommendations  Patient was given Diflucan 2 during the hospital course for possible yeast infection as she is on clindamycin and steroids  2,.spinal stenosis Stable and asymptomatic  3.. Essential hypertension: Controlled. Continue atenolol, irbesartan,hctz.  4. History of breast cancer-outpatient follow-up with breast oncologist as scheduled in March   DISCHARGE CONDITIONS:   Fair  CONSULTS OBTAINED:  Treatment Team:  Hubbard Robinson, MD Lequita Asal, MD   PROCEDURES None  DRUG ALLERGIES:   Allergies  Allergen Reactions  . Valium [Diazepam] Other (See Comments)  . Cephalexin Nausea Only  . Ciprofloxacin Diarrhea and Nausea Only    DISCHARGE MEDICATIONS:   Current Discharge Medication List    START taking these medications   Details  clindamycin (CLEOCIN) 300 MG capsule Take 1 capsule (300 mg total) by mouth 3 (three) times daily. Qty: 30 capsule, Refills: 0    HYDROcodone-acetaminophen (NORCO/VICODIN) 5-325 MG tablet Take 1-2 tablets by mouth every 4 (four) hours as needed for moderate pain. Qty: 30 tablet, Refills: 0    prednisoLONE 5 MG TABS tablet Take 1 tablet (5 mg total) by mouth daily. Label  & dispense according to the schedule below:  20 mg by mouth twice a day for 2 days followed by 10 mg by mouth twice a day for 2 days followed by 10 mg by mouth once a day for 2 days followed by 5 mg by mouth once a day for 2 days and stop Qty: 30 tablet, Refills: 0      CONTINUE these medications which have NOT CHANGED   Details  acetaminophen (TYLENOL) 650 MG  CR tablet Take 1,300 mg by mouth 2 (two) times daily.     ascorbic acid (VITAMIN C) 100 MG tablet Take 100 mg by mouth daily.    aspirin EC 81 MG tablet Take 81 mg by mouth.    atenolol (TENORMIN) 25 MG tablet Take 25 mg by mouth daily.  Refills: 2    Cholecalciferol (VITAMIN D3) 1000 units CAPS Take by mouth.    folic acid (FOLVITE) 1 MG tablet Take 1 mg by mouth.    gabapentin (NEURONTIN) 300 MG capsule 1 po bid    meloxicam (MOBIC) 15 MG tablet Take 15 mg by mouth.    nortriptyline (PAMELOR) 25 MG capsule Take 25 mg by mouth at bedtime.     nystatin (NYSTATIN) powder Apply 1 g topically 4 (four) times daily.    pantoprazole (PROTONIX) 40 MG tablet Take 40 mg by mouth daily.     RA KRILL OIL 500 MG CAPS Take 1 capsule by mouth daily.     rosuvastatin (CRESTOR) 5 MG tablet TAKE ONE TABLET AT BEDTIME    telmisartan-hydrochlorothiazide (MICARDIS HCT) 40-12.5 MG tablet Refills: 2      STOP taking these medications     nystatin cream (MYCOSTATIN)          DISCHARGE INSTRUCTIONS:   Activity as tolerated Follow-up with primary care physician in a week Follow-up with surgery Dr. Heath Lark in a week Follow-up with breast surgeon as recommended in March 2018  DIET:  Low-salt  DISCHARGE CONDITION:  Stable  ACTIVITY:  Activity as tolerated  OXYGEN:  Home Oxygen: No.   Oxygen Delivery: room air  DISCHARGE LOCATION:  home   If you experience worsening of your admission symptoms, develop shortness of breath, life threatening emergency, suicidal or homicidal thoughts you must seek medical attention immediately by calling 911 or calling your MD immediately  if symptoms less severe.  You Must read complete instructions/literature along with all the possible adverse reactions/side effects for all the Medicines you take and that have been prescribed to you. Take any new Medicines after you have completely understood and accpet all the possible adverse reactions/side  effects.   Please note  You were cared for by a hospitalist during your hospital stay. If you have any questions about your discharge medications or the care you received while you were in the hospital after you are discharged, you can call the unit and asked to speak with the hospitalist on call if the hospitalist that took care of you is not available. Once you are discharged, your primary care physician will handle any further medical issues. Please note that NO REFILLS for any discharge medications will be authorized once you are discharged, as it is imperative that you return to your primary care physician (or establish a relationship with a primary care physician if you do not have one) for your aftercare needs so that they can reassess your need for medications and monitor your lab values.     Today  Chief Complaint  Patient presents with  . Breast Pain  History of Breast CA. Has swelling tonight  . Generalized Body Aches  . Chills  . Fever   Patient is doing fine and redness and swelling of the right breast significantly improved Wants to go home  ROS:  CONSTITUTIONAL: Denies fevers, chills. Denies any fatigue, weakness.  EYES: Denies blurry vision, double vision, eye pain. EARS, NOSE, THROAT: Denies tinnitus, ear pain, hearing loss. RESPIRATORY: Denies cough, wheeze, shortness of breath.  CARDIOVASCULAR: Denies chest pain, palpitations, edema. Right breast redness improved GASTROINTESTINAL: Denies nausea, vomiting, diarrhea, abdominal pain. Denies bright red blood per rectum. GENITOURINARY: Denies dysuria, hematuria. ENDOCRINE: Denies nocturia or thyroid problems. HEMATOLOGIC AND LYMPHATIC: Denies easy bruising or bleeding. SKIN: Denies rash or lesion. MUSCULOSKELETAL: Denies pain in neck, back, shoulder, knees, hips or arthritic symptoms.  NEUROLOGIC: Denies paralysis, paresthesias. Confusion resolved PSYCHIATRIC: Denies anxiety or depressive symptoms.   VITAL SIGNS:   Blood pressure (!) 142/65, pulse 84, temperature 97.9 F (36.6 C), temperature source Oral, resp. rate 19, height 5\' 4"  (1.626 m), weight 87.6 kg (193 lb 3.2 oz), SpO2 97 %.  I/O:    Intake/Output Summary (Last 24 hours) at 04/15/16 1255 Last data filed at 04/15/16 0800  Gross per 24 hour  Intake              292 ml  Output                0 ml  Net              292 ml    PHYSICAL EXAMINATION:  GENERAL:  65 y.o.-year-old patient lying in the bed with no acute distress.  EYES: Pupils equal, round, reactive to light and accommodation. No scleral icterus. Extraocular muscles intact.  HEENT: Head atraumatic, normocephalic. Oropharynx and nasopharynx clear.  NECK:  Supple, no jugular venous distention. No thyroid enlargement, no tenderness.  LUNGS: Normal breath sounds bilaterally, no wheezing, rales,rhonchi or crepitation. No use of accessory muscles of respiration.  CARDIOVASCULAR: S1, S2 normal. No murmurs, rubs, or gallops.  ABDOMEN: Soft, non-tender, non-distended. Bowel sounds present. No organomegaly or mass.  EXTREMITIES: No pedal edema, cyanosis, or clubbing.  NEUROLOGIC: Cranial nerves II through XII are intact. Muscle strength 5/5 in all extremities. Sensation intact. Gait not checked.  PSYCHIATRIC: The patient is alert and oriented x 3.  SKIN: No obvious rash, lesion, or ulcer. The right breast erythema significantly improved and edema decreased nontender  DATA REVIEW:   CBC  Recent Labs Lab 04/13/16 1024  WBC 9.8  HGB 10.3*  HCT 29.9*  PLT 196    Chemistries   Recent Labs Lab 04/11/16 0120 04/15/16 0531  NA 135  --   K 3.5  --   CL 95*  --   CO2 30  --   GLUCOSE 120*  --   BUN 13  --   CREATININE 0.85 0.81  CALCIUM 9.8  --   AST 38  --   ALT 25  --   ALKPHOS 68  --   BILITOT 1.1  --     Cardiac Enzymes  Recent Labs Lab 04/11/16 0120  TROPONINI <0.03    Microbiology Results  Results for orders placed or performed during the hospital  encounter of 04/11/16  Blood Culture (routine x 2)     Status: None (Preliminary result)   Collection Time: 04/11/16  1:20 AM  Result Value Ref Range Status   Specimen Description BLOOD LEFT ARM  Final   Special Requests   Final  BOTTLES DRAWN AEROBIC AND ANAEROBIC 12CCAERO,11CCANA   Culture NO GROWTH 4 DAYS  Final   Report Status PENDING  Incomplete  Urine culture     Status: Abnormal   Collection Time: 04/11/16  1:20 AM  Result Value Ref Range Status   Specimen Description URINE, RANDOM  Final   Special Requests NONE  Final   Culture (A)  Final    <10,000 COLONIES/mL INSIGNIFICANT GROWTH Performed at [facility]    Report Status 04/12/2016 FINAL  Final  Blood Culture (routine x 2)     Status: None (Preliminary result)   Collection Time: 04/11/16  1:40 AM  Result Value Ref Range Status   Specimen Description BLOOD LEFT FOREARM  Final   Special Requests   Final    BOTTLES DRAWN AEROBIC AND ANAEROBIC [place]   Culture NO GROWTH 4 DAYS  Final   Report Status PENDING  Incomplete    RADIOLOGY:  US Breast Ltd Uni Right Inc Axilla  Result Date: 04/12/2016 CLINICAL DATA:  65 year old female with diffuse right breast pain, swelling and redness for 2 days. History of right breast cancer. EXAM: ULTRASOUND OF THE RIGHT BREAST COMPARISON:  None FINDINGS: On physical exam, diffuse right breast erythema noted. The right breast is firm. Targeted ultrasound is performed, showing right breast skin edema without focal collection or abscess. No suspicious mass, distortion or abnormal shadowing identified. IMPRESSION: Right breast mastitis.  No evidence of abscess or suspicious mass. RECOMMENDATION: Clinical followup. If symptoms do not improve, consider diagnostic mammogram. Bilateral screening mammograms in March 2018 to resume annual mammogram schedule. I have discussed the findings and recommendations with the patient. Results were also provided in writing at the conclusion of the  visit. If applicable, a reminder letter will be sent to the patient regarding the next appointment. BI-RADS CATEGORY  2: Benign. Electronically Signed   By: Margarette Canada M.D.   On: 04/12/2016 12:50    EKG:  No orders found for this or any previous visit.    Management plans discussed with the patient, family and they are in agreement.  CODE STATUS:     Code Status Orders        Start     Ordered   04/11/16 0426  Full code  Continuous     04/11/16 0425    Code Status History    Date Active Date Inactive Code Status Order ID Comments User Context   This patient has a current code status but no historical code status.    Advance Directive Documentation   Flowsheet Row Most Recent Value  Type of Advance Directive  Healthcare Power of Attorney, Living will  Pre-existing out of facility DNR order (yellow form or pink MOST form)  No data  "MOST" Form in Place?  No data      TOTAL TIME TAKING CARE OF THIS PATIENT: 45  minutes.   Note: This dictation was prepared with Dragon dictation along with smaller phrase technology. Any transcriptional errors that result from this process are unintentional.   @[facility] @  on 04/15/2016 at 12:55 PM  Between 7am to 6pm - Pager - [phone]  After 6pm go to [email or web address] - password [email or web address] [facility]  [facility]  Office  [phone]  CC: Primary care physician; Adin Hector, MD

## 2016-04-15 NOTE — Discharge Instructions (Signed)
Activity as tolerated Follow-up with primary care physician in a week Follow-up with surgery Dr. Heath Lark in a week Follow-up with breast surgeon as recommended in March 2018

## 2016-04-15 NOTE — Progress Notes (Signed)
65 year old female with a history of right breast lumpectomy and axillary node dissection for breast cancer with radiation and chemotherapy 20 years prior comes in with a red inflamed cellulitic breast.  Patient did much better overnight, slept well without any confusion.  She was able to sleep on her right side as well with much less pain.    Vitals:   04/14/16 2033 04/15/16 0353  BP: (!) 151/75 134/67  Pulse: 87 81  Resp: 20 19  Temp: 98 F (36.7 C) 97.9 F (36.6 C)   I/O last 3 completed shifts: In: 1092.8 [P.O.:360; I.V.:630.8; IV Piggyback:102] Out: -  No intake/output data recorded.   PE:  Gen: NAD Res: CTAB/L  Cardio: RRR, no tachycardia at this time Right breast: Still with slightly darker skin over the area but no erythema, minimal thickness still in lateral area but erythema, edema and cellulitis all drastically improved from previously  GI: soft, non tender Ext: no edema  CBC Latest Ref Rng & Units 04/13/2016 04/12/2016 04/11/2016  WBC 3.6 - 11.0 K/uL 9.8 11.4(H) 23.0(H)  Hemoglobin 12.0 - 16.0 g/dL 10.3(L) 10.3(L) 11.8(L)  Hematocrit 35.0 - 47.0 % 29.9(L) 30.5(L) 33.8(L)  Platelets 150 - 440 K/uL 196 216 282   CMP Latest Ref Rng & Units 04/15/2016 04/11/2016 11/04/2015  Glucose 65 - 99 mg/dL - 120(H) 112(H)  BUN 6 - 20 mg/dL - 13 13  Creatinine 0.44 - 1.00 mg/dL 0.81 0.85 0.78  Sodium 135 - 145 mmol/L - 135 130(L)  Potassium 3.5 - 5.1 mmol/L - 3.5 2.6(LL)  Chloride 101 - 111 mmol/L - 95(L) 89(L)  CO2 22 - 32 mmol/L - 30 32  Calcium 8.9 - 10.3 mg/dL - 9.8 9.8  Total Protein 6.5 - 8.1 g/dL - 7.3 7.5  Total Bilirubin 0.3 - 1.2 mg/dL - 1.1 0.7  Alkaline Phos 38 - 126 U/L - 68 61  AST 15 - 41 U/L - 38 77(H)  ALT 14 - 54 U/L - 25 46   A/P:  65 year old female with right breast cellulitis with a history of right breast cancer and whole breast radiation.   Patient has had drastic improvement with the steroids.  I have discussed with Dr. Margaretmary Eddy of internal medicine as  well.  She is to be discharged today on steroid taper 20mg  BID for two days, then 10mg , then 5mg .  She also will have clinidamycin for 14 days as well.  She will continue nystatin powder to breast and cold compresses as needed and will complete second dose of  diflucan today.  I will have her f/u in my office next week to ensure improvement.

## 2016-04-15 NOTE — Progress Notes (Signed)
Discharge instructions along with home medications and follow up gone over with patient and husband. Both verbalize that they understood instructions. 2 prescriptions given to patient. IV's removed. Pt being discharged home on room air, no distress noted. Adi Doro S Fenton, RN 

## 2016-04-16 LAB — CULTURE, BLOOD (ROUTINE X 2)
Culture: NO GROWTH
Culture: NO GROWTH

## 2016-04-22 ENCOUNTER — Encounter: Payer: Self-pay | Admitting: Surgery

## 2016-04-22 ENCOUNTER — Ambulatory Visit (INDEPENDENT_AMBULATORY_CARE_PROVIDER_SITE_OTHER): Payer: BLUE CROSS/BLUE SHIELD | Admitting: Surgery

## 2016-04-22 VITALS — BP 155/82 | HR 89 | Temp 98.3°F | Ht 64.0 in | Wt 186.8 lb

## 2016-04-22 DIAGNOSIS — N61 Mastitis without abscess: Secondary | ICD-10-CM

## 2016-04-22 MED ORDER — FLUCONAZOLE 150 MG PO TABS: 150.0000 mg | ORAL_TABLET | Freq: Once | ORAL | 0 refills | Status: AC

## 2016-04-22 NOTE — Progress Notes (Signed)
Subjective:     Patient ID: Veronica Cisneros, female   DOB: 1950-10-12, 65 y.o.   MRN: [id number]  HPI  65 year old female who was recently discharged from the hospital with a severe inflammatory reaction and infection of her right breast. She does have a history of breast cancer back in 1997 when she had lumpectomy and whole breast irradiation as well as a complete node dissection on the right axilla. Underwent chemotherapy around that time as well. Approximately 3 years ago she had the same thing happened and was treated up at [facility] with a steroid taper which resolved the area. I also discussed this in the hospital with Dr. Grayland Ormond who has a lot of the patient that is similar. She has had a candidal rash infection underneath the breasts for a while and attempts to keep this area dry and using nystatin powder. The area drastically resolved with steroids and she completed her taper 2 days ago. She still has a couple days of antibiotics left. Patient states that she is overall doing well and not having any pain in the area and no more redness however she does have slight tenderness in beginnings of the yeast rash underneath the right breast. Patient has had no further fevers chills or right ureters either. The patient states that she does still have some fatigue and didn't tell that her body is overcoming the infection. She's also had some insomnia due to the steroids but is on melatonin which is helping  Past Medical History:  Diagnosis Date  . Blood transfusion without reported diagnosis   . breast cancer   . Bursitis   . Colon polyps   . High cholesterol   . Hypertension   . IBS (irritable bowel syndrome)   . Spinal stenosis    Past Surgical History:  Procedure Laterality Date  . ABDOMINAL HYSTERECTOMY    . BREAST SURGERY    . colonscopy    . FL INJ RIGHT KNEE MR ARTHROGRAM ([facility] HX)    . LYMPH GLAND EXCISION    . SPINE SURGERY     Family History  Problem Relation Age of Onset  .  Hypertension Mother    Social History   Social History  . Marital status: Married    Spouse name: N/A  . Number of children: N/A  . Years of education: N/A   Occupational History  . retired    Social History Main Topics  . Smoking status: Never Smoker  . Smokeless tobacco: Never Used  . Alcohol use 12.6 oz/week    21 Glasses of wine per week  . Drug use: No  . Sexual activity: Not Asked   Other Topics Concern  . None   Social History Narrative  . None    Current Outpatient Prescriptions:  .  acetaminophen (TYLENOL) 650 MG CR tablet, Take 1,300 mg by mouth 2 (two) times daily. , Disp: , Rfl:  .  ascorbic acid (VITAMIN C) 100 MG tablet, Take 100 mg by mouth daily., Disp: , Rfl:  .  aspirin EC 81 MG tablet, Take 81 mg by mouth., Disp: , Rfl:  .  atenolol (TENORMIN) 25 MG tablet, Take 25 mg by mouth daily. , Disp: , Rfl: 2 .  Cholecalciferol (VITAMIN D3) 1000 units CAPS, Take by mouth., Disp: , Rfl:  .  folic acid (FOLVITE) 1 MG tablet, Take 1 mg by mouth., Disp: , Rfl:  .  gabapentin (NEURONTIN) 300 MG capsule, 1 po bid, Disp: , Rfl:  .  meloxicam (MOBIC) 15 MG tablet, Take 15 mg by mouth., Disp: , Rfl:  .  nortriptyline (PAMELOR) 25 MG capsule, Take 25 mg by mouth at bedtime. , Disp: , Rfl:  .  nystatin (NYSTATIN) powder, Apply 1 g topically 4 (four) times daily., Disp: , Rfl:  .  pantoprazole (PROTONIX) 40 MG tablet, Take 40 mg by mouth daily. , Disp: , Rfl:  .  RA KRILL OIL 500 MG CAPS, Take 1 capsule by mouth daily. , Disp: , Rfl:  .  rosuvastatin (CRESTOR) 5 MG tablet, TAKE ONE TABLET AT BEDTIME, Disp: , Rfl:  .  telmisartan-hydrochlorothiazide (MICARDIS HCT) 40-12.5 MG tablet, , Disp: , Rfl: 2 .  fluconazole (DIFLUCAN) 150 MG tablet, Take 1 tablet (150 mg total) by mouth once., Disp: 1 tablet, Rfl: 0 Allergies  Allergen Reactions  . Cephalexin Nausea Only  . Ciprofloxacin Diarrhea and Nausea Only  . Valium [Diazepam] Other (See Comments)    Review of Systems   Constitutional: Positive for fatigue. Negative for activity change, appetite change, chills and fever.  HENT: Negative for congestion and sore throat.   Respiratory: Negative for chest tightness, shortness of breath and wheezing.   Cardiovascular: Negative for chest pain, palpitations and leg swelling.  Gastrointestinal: Negative for abdominal distention, diarrhea and nausea.  Genitourinary: Negative for dysuria, hematuria, vaginal discharge and vaginal pain.  Musculoskeletal: Negative for back pain.  Neurological: Negative for dizziness and weakness.  Hematological: Negative for adenopathy. Does not bruise/bleed easily.  Psychiatric/Behavioral: Negative for agitation. The patient is not nervous/anxious.   All other systems reviewed and are negative.      Vitals:   04/22/16 1007  BP: (!) 155/82  Pulse: 89  Temp: 98.3 F (36.8 C)    Objective:   Physical Exam  Constitutional: She is oriented to person, place, and time. She appears well-developed and well-nourished. No distress.  HENT:  Head: Normocephalic and atraumatic.  Nose: Nose normal.  Mouth/Throat: Oropharynx is clear and moist. No oropharyngeal exudate.  Eyes: Conjunctivae are normal. Pupils are equal, round, and reactive to light. No scleral icterus.  Neck: Normal range of motion. No tracheal deviation present.  Cardiovascular: Normal rate and regular rhythm.   Pulmonary/Chest: Effort normal. No respiratory distress.  Abdominal: Soft. She exhibits no distension.  Musculoskeletal: Normal range of motion. She exhibits no edema.  Neurological: She is alert and oriented to person, place, and time.  Skin: Skin is warm and dry. No rash noted. No erythema.  Right breast with normal-appearing skin without any discoloration or erythema at this time. The patient does have a scar from previous lumpectomy with slight dimpling in this area which patient states is normal, some mild thickening of the skin in the lateral portion with  excoriation in this area no mass palpable and nontender area mild yeast rash on the underneath side of the right breast  Psychiatric: She has a normal mood and affect. Her behavior is normal. Judgment and thought content normal.  Vitals reviewed.      CBC Latest Ref Rng & Units 04/13/2016 04/12/2016 04/11/2016  WBC 3.6 - 11.0 K/uL 9.8 11.4(H) 23.0(H)  Hemoglobin 12.0 - 16.0 g/dL 10.3(L) 10.3(L) 11.8(L)  Hematocrit 35.0 - 47.0 % 29.9(L) 30.5(L) 33.8(L)  Platelets 150 - 440 K/uL 196 216 282   CMP Latest Ref Rng & Units 04/15/2016 04/11/2016 11/04/2015  Glucose 65 - 99 mg/dL - 120(H) 112(H)  BUN 6 - 20 mg/dL - 13 13  Creatinine 0.44 - 1.00 mg/dL 0.81 0.85 0.78  Sodium 135 - 145 mmol/L - 135 130(L)  Potassium 3.5 - 5.1 mmol/L - 3.5 2.6(LL)  Chloride 101 - 111 mmol/L - 95(L) 89(L)  CO2 22 - 32 mmol/L - 30 32  Calcium 8.9 - 10.3 mg/dL - 9.8 9.8  Total Protein 6.5 - 8.1 g/dL - 7.3 7.5  Total Bilirubin 0.3 - 1.2 mg/dL - 1.1 0.7  Alkaline Phos 38 - 126 U/L - 68 61  AST 15 - 41 U/L - 38 77(H)  ALT 14 - 54 U/L - 25 22    Assessment:     65 year old female with severe inflammatory reaction and cellulitis recently seen in the hospital    Plan:     I discussed again with this very pleasant patient that what she has is a rare but it is a reaction from not having lymph node drainage to the area and having radiated skin and that it can return if she gets an infection in the area again and would need to be placed on steroids as soon as possible when she sees that this is occurring. 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 The patient is continuing her nystatin powder underneath the breast and I will give her 1 more dose of Diflucan to see if we can't clear all of this up. She follows up with the survivorship clinic at [facility] and has her regular mammograms and exams done in March of every year. She will call our office if there are any changes or if she has any questions and if she  has any redness starting in this area will get her in as soon as possible and start her on a steroid taper. The patient was very appreciative of her care given opportunity to ask questions and will call us with any questions or concerns.

## 2016-04-22 NOTE — Patient Instructions (Signed)
Call our office with any questions or concerns that you have or if this redness begins once again. We will start you on steroids immediately and get you in to see the surgeon the next available appointment.  I have sent your prescription to the pharmacy.

## 2016-07-15 ENCOUNTER — Telehealth: Payer: Self-pay

## 2016-07-15 DIAGNOSIS — M1990 Unspecified osteoarthritis, unspecified site: Secondary | ICD-10-CM | POA: Insufficient documentation

## 2016-07-15 DIAGNOSIS — E041 Nontoxic single thyroid nodule: Secondary | ICD-10-CM | POA: Insufficient documentation

## 2016-07-15 DIAGNOSIS — K5790 Diverticulosis of intestine, part unspecified, without perforation or abscess without bleeding: Secondary | ICD-10-CM | POA: Insufficient documentation

## 2016-07-15 DIAGNOSIS — D649 Anemia, unspecified: Secondary | ICD-10-CM | POA: Insufficient documentation

## 2016-07-15 DIAGNOSIS — IMO0002 Reserved for concepts with insufficient information to code with codable children: Secondary | ICD-10-CM | POA: Insufficient documentation

## 2016-07-15 DIAGNOSIS — K589 Irritable bowel syndrome without diarrhea: Secondary | ICD-10-CM | POA: Insufficient documentation

## 2016-07-15 NOTE — Telephone Encounter (Signed)
The patient called trying to get an appointment for today. She is an established breast patient of Dr. Langston Masker. The patient states that her right breast is inflamed again and it began this morning. We do not have an appointment today but I offered her one for tomorrow morning. Patient declined saying that she is going out of town tomorrow and will call her PCP to see what they can do.

## 2016-07-16 ENCOUNTER — Ambulatory Visit (INDEPENDENT_AMBULATORY_CARE_PROVIDER_SITE_OTHER): Payer: BLUE CROSS/BLUE SHIELD | Admitting: Surgery

## 2016-07-16 ENCOUNTER — Encounter: Payer: Self-pay | Admitting: Surgery

## 2016-07-16 VITALS — BP 180/109 | HR 106 | Temp 98.3°F | Ht 64.0 in | Wt 195.2 lb

## 2016-07-16 DIAGNOSIS — N61 Mastitis without abscess: Secondary | ICD-10-CM

## 2016-07-16 NOTE — Patient Instructions (Addendum)
  Please call our office if you have any questions or concerns.Please see your follow up appointment listed below. 

## 2016-07-16 NOTE — Progress Notes (Signed)
Outpatient Surgical Follow Up  07/16/2016  Veronica Cisneros is an 66 y.o. female.   CC: Breast pain  HPI: This patient with a history of breast cancer who has had breast pain in the past. She saw Dr. Azalee Course for this same exact pain in December. She was placed on steroids yesterday and states that her pain is much improved. This worked for her in the past as well. It was believed that this may be radiation induced. She had a negative ultrasound in December that failed to show any identifiable abscess pocket. She follows up with [facility] A Dr. Harden Mo and she follows up with Dr. Caryl Comes is well she has seen Dr. Grayland Ormond at our cancer center.  This patient describes an identical process to what occurred in December. She was worked up for sepsis at that time and was treated with antibiotics in the hospital but did not go home on antibiotics. The great majority of her improvement was the result of steroids.  Patient describes the acute onset of a patch of redness that rapidly spread yesterday morning she was placed on steroids yesterday afternoon and that redness has completely resolved her pain is nearly resolved and she feels much better. 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  Past Medical History:  Diagnosis Date  . Blood transfusion without reported diagnosis   . breast cancer   . Bursitis   . Colon polyps   . High cholesterol   . Hypertension   . IBS (irritable bowel syndrome)   . Spinal stenosis     Past Surgical History:  Procedure Laterality Date  . ABDOMINAL HYSTERECTOMY    . BREAST SURGERY    . colonscopy    . FL INJ RIGHT KNEE MR ARTHROGRAM ([facility] HX)    . LYMPH GLAND EXCISION    . SPINE SURGERY      Family History  Problem Relation Age of Onset  . Hypertension Mother     Social History:  reports that she has never smoked.  She has never used smokeless tobacco. She reports that she drinks alcohol. She reports that she does not use drugs.  Allergies:  Allergies  Allergen Reactions  . Cephalexin Nausea Only  . Ciprofloxacin Diarrhea and Nausea Only  . Valium [Diazepam] Other (See Comments)    Medications reviewed.   Review of Systems:   Review of Systems  Constitutional: Negative for chills and fever.  Skin: Negative.      Physical Exam:  BP (!) 180/109   Pulse (!) 106   Temp 98.3 F (36.8 C) (Oral)   Ht 5\' 4"  (1.626 m)   Wt 195 lb 3.2 oz (88.5 kg)   BMI 33.51 kg/m   Physical Exam  Constitutional: She is well-developed, well-nourished, and in no distress. No distress.  HENT:  Head: Normocephalic and atraumatic.  Eyes: Right eye exhibits no discharge. Left eye exhibits no discharge. No scleral icterus.  Skin: Skin is warm and dry. No rash noted. She is not diaphoretic. No erythema.  Bilateral breast exam is performed. No mass in the left breast.  In the right breast there is a scar in the axilla a scar in the upper outer quadrant and a medial scar. There is induration of the major portion of the breast centrally. There is mild erythema (patient states it is much improved over yesterday). It  is almost not noticeable.  Vitals reviewed.     No results found for this or any previous visit (from the past 48 hour(s)). No results found.  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 Reviewing Dr. Geoffry Paradise note from December as well as notes from prior provider visits it appears that this is the case. Patient has responded rapidly and dramatically to a short dose of steroids started yesterday. I see no sign of sepsis at this time and see no reason to start antibiotics. Patient is so well attended to her condition that I have no doubt that should she worsen she would return immediately. And she understands the need to do so. She will follow-up next week or sooner should she  need to.  Florene Glen, MD, FACS

## 2016-07-22 ENCOUNTER — Ambulatory Visit (INDEPENDENT_AMBULATORY_CARE_PROVIDER_SITE_OTHER): Payer: BLUE CROSS/BLUE SHIELD | Admitting: General Surgery

## 2016-07-22 ENCOUNTER — Encounter: Payer: Self-pay | Admitting: General Surgery

## 2016-07-22 VITALS — BP 170/96 | HR 86 | Temp 97.5°F | Ht 64.0 in

## 2016-07-22 DIAGNOSIS — Z853 Personal history of malignant neoplasm of breast: Secondary | ICD-10-CM

## 2016-07-22 MED ORDER — PREDNISONE 10 MG PO TABS: 10.0000 mg | ORAL_TABLET | ORAL | 0 refills | Status: DC

## 2016-07-22 NOTE — Patient Instructions (Signed)
Please call our office if you have questions or concerns.   

## 2016-07-22 NOTE — Progress Notes (Signed)
Outpatient Surgical Follow Up  07/22/2016  Veronica Cisneros is an 66 y.o. female.   Chief Complaint  Patient presents with  . Other    Established Patient- New Breast Pain    HPI: 66 year old female well-known the surgery service returns to clinic for follow-up. She reports that her breast erythema and pain completely resolved on the steroids. No current complaints. She denies any fevers, chills, nausea, vomiting, chest pain, short of breath, diarrhea, constipation. All her breast symptoms of completely resolved.  Past Medical History:  Diagnosis Date  . Blood transfusion without reported diagnosis   . breast cancer   . Bursitis   . Colon polyps   . High cholesterol   . Hypertension   . IBS (irritable bowel syndrome)   . Spinal stenosis     Past Surgical History:  Procedure Laterality Date  . ABDOMINAL HYSTERECTOMY    . BREAST SURGERY    . colonscopy    . FL INJ RIGHT KNEE MR ARTHROGRAM ([facility] HX)    . LYMPH GLAND EXCISION    . SPINE SURGERY      Family History  Problem Relation Age of Onset  . Hypertension Mother     Social History:  reports that she has never smoked. She has never used smokeless tobacco. She reports that she drinks alcohol. She reports that she does not use drugs.  Allergies:  Allergies  Allergen Reactions  . Cephalexin Nausea Only  . Ciprofloxacin Diarrhea and Nausea Only  . Valium [Diazepam] Other (See Comments)    Medications reviewed.    ROS A multipoint review of systems was completed. All pertinent positives and negatives are documented within the history of present illness the remainder negative   BP (!) 170/96   Pulse 86   Temp 97.5 F (36.4 C) (Oral)   Ht 5\' 4"  (1.626 m)   Physical Exam Gen.: No acute distress Neck: Supple, nontender, no evidence of lymphadenopathy Breast: Bilateral breast exam. No evidence of erythema or drainage. No palpable dominant masses. Chest: Clear to auscultation Heart: Regular rhythm Abdomen:  Soft and nontender    No results found for this or any previous visit (from the past 48 hour(s)). No results found.  Assessment/Plan:  1. History of breast cancer 66 year old female with a history of breast cancer and recurrent postradiation syndrome with erythema that results to steroids. Currently completely resolved after most recent treatment. Given paper prescription for prednisone today to have on hand in case she needs it over the next year. All questions answered to patient's satisfaction. She'll continue her follow-up with the [facility] care center later this year. Follow-up in our clinic on an as-needed basis.  A total of 15 minutes was used on this encounter with greater than 50% of a user counseling and coordination of care.     Clayburn Pert, MD [facility] General Surgeon  07/22/2016,9:21 AM

## 2017-02-09 ENCOUNTER — Encounter (INDEPENDENT_AMBULATORY_CARE_PROVIDER_SITE_OTHER): Payer: Self-pay

## 2018-02-09 ENCOUNTER — Other Ambulatory Visit: Payer: Self-pay | Admitting: Physical Medicine and Rehabilitation

## 2018-02-09 DIAGNOSIS — M5416 Radiculopathy, lumbar region: Secondary | ICD-10-CM

## 2018-02-13 ENCOUNTER — Other Ambulatory Visit: Payer: Self-pay | Admitting: Internal Medicine

## 2018-02-13 DIAGNOSIS — R109 Unspecified abdominal pain: Secondary | ICD-10-CM

## 2018-02-15 ENCOUNTER — Ambulatory Visit
Admission: RE | Admit: 2018-02-15 | Discharge: 2018-02-15 | Disposition: A | Discharge status: Home / Self Care | Payer: BLUE CROSS/BLUE SHIELD | Source: Ambulatory Visit | Attending: Physical Medicine and Rehabilitation | Admitting: Physical Medicine and Rehabilitation

## 2018-02-15 DIAGNOSIS — M5416 Radiculopathy, lumbar region: Secondary | ICD-10-CM

## 2018-02-16 ENCOUNTER — Ambulatory Visit
Admission: RE | Admit: 2018-02-16 | Discharge: 2018-02-16 | Disposition: A | Discharge status: Home / Self Care | Payer: BLUE CROSS/BLUE SHIELD | Source: Ambulatory Visit | Attending: Physical Medicine and Rehabilitation | Admitting: Physical Medicine and Rehabilitation

## 2018-02-16 ENCOUNTER — Other Ambulatory Visit: Payer: Self-pay | Admitting: Physical Medicine and Rehabilitation

## 2018-02-16 DIAGNOSIS — R109 Unspecified abdominal pain: Secondary | ICD-10-CM

## 2018-02-16 MED ORDER — IOPAMIDOL (ISOVUE-300) INJECTION 61%
125.0000 mL | Freq: Once | INTRAVENOUS | Status: AC | PRN
  Administered 2018-02-16: 125 mL via INTRAVENOUS

## 2018-05-29 DIAGNOSIS — C8213 Follicular lymphoma grade II, intra-abdominal lymph nodes: Secondary | ICD-10-CM | POA: Insufficient documentation

## 2019-02-02 ENCOUNTER — Other Ambulatory Visit: Payer: Self-pay | Admitting: Physical Medicine and Rehabilitation

## 2019-02-02 DIAGNOSIS — M5416 Radiculopathy, lumbar region: Secondary | ICD-10-CM

## 2019-02-08 ENCOUNTER — Other Ambulatory Visit: Payer: Self-pay | Admitting: Physical Medicine and Rehabilitation

## 2019-02-08 DIAGNOSIS — M5416 Radiculopathy, lumbar region: Secondary | ICD-10-CM

## 2019-02-12 ENCOUNTER — Ambulatory Visit: Payer: BC Managed Care – PPO

## 2019-02-18 ENCOUNTER — Ambulatory Visit
Admission: RE | Admit: 2019-02-18 | Discharge: 2019-02-18 | Disposition: A | Discharge status: Home / Self Care | Payer: BC Managed Care – PPO | Source: Ambulatory Visit | Attending: Physical Medicine and Rehabilitation | Admitting: Physical Medicine and Rehabilitation

## 2019-02-18 ENCOUNTER — Other Ambulatory Visit: Payer: Self-pay

## 2019-02-18 DIAGNOSIS — M5416 Radiculopathy, lumbar region: Secondary | ICD-10-CM

## 2019-05-23 ENCOUNTER — Ambulatory Visit: Payer: Self-pay | Attending: Internal Medicine

## 2019-05-23 DIAGNOSIS — Z23 Encounter for immunization: Secondary | ICD-10-CM | POA: Insufficient documentation

## 2019-05-23 NOTE — Progress Notes (Signed)
   Covid-19 Vaccination Clinic  Name:  ALIANDRA GARDE    MRN: [id number] DOB: [birth year]  05/23/2019  Ms. Labrado was observed post Covid-19 immunization for 15 minutes without incidence. She was provided with Vaccine Information Sheet and instruction to access the V-Safe system.   Ms. Ara was instructed to call 911 with any severe reactions post vaccine: Marland Kitchen Difficulty breathing  . Swelling of your face and throat  . A fast heartbeat  . A bad rash all over your body  . Dizziness and weakness    Immunizations Administered    Name Date Dose VIS Date Route   Pfizer COVID-19 Vaccine 05/23/2019  9:35 AM 0.3 mL 04/13/2019 Intramuscular   Manufacturer: [employer]   Lot: [id number]   NDC: [id number]

## 2019-06-13 ENCOUNTER — Ambulatory Visit: Payer: Self-pay | Attending: Internal Medicine

## 2019-06-13 DIAGNOSIS — Z23 Encounter for immunization: Secondary | ICD-10-CM | POA: Insufficient documentation

## 2019-06-13 NOTE — Progress Notes (Signed)
   Covid-19 Vaccination Clinic  Name:  Veronica Cisneros    MRN: [id number] DOB: 1950-11-27  06/13/2019  [name] was observed post Covid-19 immunization for 15 minutes without incidence. She was provided with Vaccine Information Sheet and instruction to access the V-Safe system.   [name] was instructed to call 911 with any severe reactions post vaccine: Marland Kitchen Difficulty breathing  . Swelling of your face and throat  . A fast heartbeat  . A bad rash all over your body  . Dizziness and weakness    Immunizations Administered    Name Date Dose VIS Date Route   Pfizer COVID-19 Vaccine 06/13/2019  8:27 AM 0.3 mL 04/13/2019 Intramuscular   Manufacturer: [place]   Lot: [id number]   [place]: [id number]

## 2020-02-08 ENCOUNTER — Other Ambulatory Visit: Payer: Self-pay | Admitting: Internal Medicine

## 2020-02-08 ENCOUNTER — Other Ambulatory Visit (HOSPITAL_COMMUNITY): Payer: Self-pay | Admitting: Internal Medicine

## 2020-02-08 DIAGNOSIS — C8213 Follicular lymphoma grade II, intra-abdominal lymph nodes: Secondary | ICD-10-CM

## 2020-02-08 DIAGNOSIS — R5383 Other fatigue: Secondary | ICD-10-CM

## 2020-02-08 DIAGNOSIS — R519 Headache, unspecified: Secondary | ICD-10-CM

## 2020-02-20 ENCOUNTER — Ambulatory Visit: Payer: BC Managed Care – PPO

## 2020-03-04 ENCOUNTER — Ambulatory Visit: Payer: BC Managed Care – PPO

## 2020-03-12 ENCOUNTER — Other Ambulatory Visit: Payer: Self-pay

## 2020-03-12 ENCOUNTER — Ambulatory Visit
Admission: RE | Admit: 2020-03-12 | Discharge: 2020-03-12 | Disposition: A | Discharge status: Home / Self Care | Payer: BC Managed Care – PPO | Source: Ambulatory Visit | Attending: Internal Medicine | Admitting: Internal Medicine

## 2020-03-12 DIAGNOSIS — R5383 Other fatigue: Secondary | ICD-10-CM

## 2020-03-12 DIAGNOSIS — R519 Headache, unspecified: Secondary | ICD-10-CM

## 2020-03-12 DIAGNOSIS — C8213 Follicular lymphoma grade II, intra-abdominal lymph nodes: Secondary | ICD-10-CM

## 2020-03-12 LAB — POCT I-STAT CREATININE: Creatinine, Ser: 1.2 mg/dL — ABNORMAL HIGH (ref 0.44–1.00)

## 2020-03-12 MED ORDER — IOHEXOL 300 MG/ML  SOLN
75.0000 mL | Freq: Once | INTRAMUSCULAR | Status: AC | PRN
  Administered 2020-03-12: 75 mL via INTRAVENOUS

## 2021-05-20 DIAGNOSIS — I517 Cardiomegaly: Secondary | ICD-10-CM | POA: Insufficient documentation

## 2021-11-25 DIAGNOSIS — Z923 Personal history of irradiation: Secondary | ICD-10-CM | POA: Insufficient documentation

## 2022-03-17 ENCOUNTER — Other Ambulatory Visit: Payer: Self-pay | Admitting: Internal Medicine

## 2022-03-17 DIAGNOSIS — R600 Localized edema: Secondary | ICD-10-CM

## 2022-03-17 DIAGNOSIS — C8213 Follicular lymphoma grade II, intra-abdominal lymph nodes: Secondary | ICD-10-CM

## 2022-03-29 ENCOUNTER — Ambulatory Visit
Admission: RE | Admit: 2022-03-29 | Discharge: 2022-03-29 | Disposition: A | Discharge status: Home / Self Care | Payer: BC Managed Care – PPO | Source: Ambulatory Visit | Attending: Internal Medicine | Admitting: Internal Medicine

## 2022-03-29 DIAGNOSIS — C8213 Follicular lymphoma grade II, intra-abdominal lymph nodes: Secondary | ICD-10-CM | POA: Insufficient documentation

## 2022-03-29 DIAGNOSIS — R600 Localized edema: Secondary | ICD-10-CM | POA: Insufficient documentation

## 2022-03-29 MED ORDER — IOHEXOL 300 MG/ML  SOLN
100.0000 mL | Freq: Once | INTRAMUSCULAR | Status: AC | PRN
  Administered 2022-03-29: 100 mL via INTRAVENOUS

## 2023-09-04 NOTE — Discharge Instructions (Addendum)
 Instructions after Total Knee Replacement   Veronica Cisneros., M.D.    Dept. of Orthopaedics & Sports Medicine [facility] [address] [place], [place]  [address]  Phone: [phone]   Fax: [phone]       [email or web address]       DIET: Drink plenty of non-alcoholic fluids. Resume your normal diet. Include foods high in fiber.  ACTIVITY:  You may use crutches or a walker with weight-bearing as tolerated, unless instructed otherwise. You may be weaned off of the walker or crutches by your Physical Therapist.  Do NOT place pillows under the knee. Anything placed under the knee could limit your ability to straighten the knee.   Use the Bone Foam 3 times a day for 30 minutes each session to help straighten the knee. Continue doing gentle exercises. Exercising will reduce the pain and swelling, increase motion, and prevent muscle weakness.   Please continue to use the TED compression stockings for 6 weeks. You may remove the stockings at night, but should reapply them in the morning. Do not drive or operate any equipment until instructed.  WOUND CARE:  The initial dressing (Aquacel) can remain in place for 7 days (see separate instructions). Continue to use the PolarCare or ice packs periodically to reduce pain and swelling. You may bathe or shower after the staples are removed at the first office visit following surgery.  MEDICATIONS: You may resume your regular medications. Please take the pain medication as prescribed on the medication. Do not take pain medication on an empty stomach. Unless instructed otherwise, you should take an enteric-coated aspirin 81 mg. TWICE a day. (This along with elevation will help reduce the possibility of blood clots/phlebitis in your operated leg.) Use a stool softener (such as Senokot-S or Colace) daily and a laxative (such as Miralax or Dulcolax) as needed to prevent constipation.  Do not drive or drink alcoholic beverages when  taking pain medications.  CALL THE OFFICE FOR: Temperature above 101 degrees Excessive bleeding or drainage on the dressing. Excessive swelling, coldness, or paleness of the toes. Persistent nausea and vomiting.  FOLLOW-UP:  You should have an appointment to return to the office in 10-14 days after surgery. Arrangements have been made for continuation of Physical Therapy (either home therapy or outpatient therapy).     [facility] Department Directory         [email or web address]       [email or web address]          Cardiology  Appointments: [place] - [phone]  Endocrinology  Appointments: [place] [phone] [place] - [phone]  Gastroenterology  Appointments: [place] [phone] [place] - [phone]        General Surgery   Appointments: [facility]  Internal Medicine/Family Medicine  Appointments: [facility] [place] - [phone] [place] - [phone]  Metabolic and Weigh Loss Surgery  Appointments: [facility]        Neurology  Appointments: [place] [phone] [place] - [phone]  Neurosurgery  Appointments: [place]  Obstetrics & Gynecology  Appointments: [place] [phone] [place] - [phone]        Pediatrics  Appointments: [place] [phone] [place] - [phone]  Physiatry  Appointments: [place] [phone]  Physical Therapy  Appointments: [place] - [phone]        Podiatry  Appointments: [place] [phone] [place] - [phone]  Pulmonology  Appointments: [place]  Rheumatology  Appointments: [place] [phone]  [place] Location: [facility]  [address] [place], [place]  [address]  [place] Location: [facility]. [address] [place], [place]  [address]  [place]  Location: [facility] [address] [place], [place]  [address]     [employer]  [address] , [place], [place], [address]. [phone] They will call you to arrange when they can come to see you

## 2023-09-05 ENCOUNTER — Other Ambulatory Visit: Payer: Self-pay

## 2023-09-05 ENCOUNTER — Encounter: Payer: Self-pay | Admitting: Orthopedic Surgery

## 2023-09-05 ENCOUNTER — Encounter
Admission: RE | Admit: 2023-09-05 | Discharge: 2023-09-05 | Disposition: A | Discharge status: Home / Self Care | Source: Ambulatory Visit | Attending: Orthopedic Surgery | Admitting: Orthopedic Surgery

## 2023-09-05 ENCOUNTER — Ambulatory Visit
Admission: RE | Admit: 2023-09-05 | Discharge: 2023-09-05 | Disposition: A | Discharge status: Home / Self Care | Source: Ambulatory Visit | Attending: Urgent Care | Admitting: Urgent Care

## 2023-09-05 ENCOUNTER — Ambulatory Visit
Admission: RE | Admit: 2023-09-05 | Discharge: 2023-09-05 | Disposition: A | Discharge status: Home / Self Care | Source: Ambulatory Visit | Attending: Orthopedic Surgery | Admitting: Orthopedic Surgery

## 2023-09-05 VITALS — BP 150/80 | HR 76 | Resp 16 | Ht 63.0 in | Wt 181.0 lb

## 2023-09-05 DIAGNOSIS — Z01812 Encounter for preprocedural laboratory examination: Secondary | ICD-10-CM

## 2023-09-05 DIAGNOSIS — M25552 Pain in left hip: Secondary | ICD-10-CM

## 2023-09-05 DIAGNOSIS — M1711 Unilateral primary osteoarthritis, right knee: Secondary | ICD-10-CM

## 2023-09-05 DIAGNOSIS — D649 Anemia, unspecified: Secondary | ICD-10-CM | POA: Insufficient documentation

## 2023-09-05 DIAGNOSIS — M7989 Other specified soft tissue disorders: Secondary | ICD-10-CM | POA: Diagnosis not present

## 2023-09-05 DIAGNOSIS — W19XXXA Unspecified fall, initial encounter: Secondary | ICD-10-CM | POA: Diagnosis not present

## 2023-09-05 DIAGNOSIS — E538 Deficiency of other specified B group vitamins: Secondary | ICD-10-CM | POA: Insufficient documentation

## 2023-09-05 DIAGNOSIS — S7002XA Contusion of left hip, initial encounter: Secondary | ICD-10-CM | POA: Diagnosis present

## 2023-09-05 DIAGNOSIS — Z01818 Encounter for other preprocedural examination: Secondary | ICD-10-CM | POA: Insufficient documentation

## 2023-09-05 HISTORY — DX: Other complications of anesthesia, initial encounter: T88.59XA

## 2023-09-05 HISTORY — DX: Basal cell carcinoma of skin, unspecified: C44.91

## 2023-09-05 HISTORY — DX: Gastro-esophageal reflux disease without esophagitis: K21.9

## 2023-09-05 HISTORY — DX: Cardiac murmur, unspecified: R01.1

## 2023-09-05 LAB — COMPREHENSIVE METABOLIC PANEL WITH GFR
ALT: 15 U/L (ref 0–44)
AST: 20 U/L (ref 15–41)
Albumin: 4 g/dL (ref 3.5–5.0)
Alkaline Phosphatase: 61 U/L (ref 38–126)
Anion gap: 9 (ref 5–15)
BUN: 11 mg/dL (ref 8–23)
CO2: 30 mmol/L (ref 22–32)
Calcium: 9.7 mg/dL (ref 8.9–10.3)
Chloride: 96 mmol/L — ABNORMAL LOW (ref 98–111)
Creatinine, Ser: 0.69 mg/dL (ref 0.44–1.00)
GFR, Estimated: >60 mL/min (ref >60)
Glucose, Bld: 98 mg/dL (ref 70–99)
Potassium: 3.9 mmol/L (ref 3.5–5.1)
Sodium: 135 mmol/L (ref 135–145)
Total Bilirubin: 0.9 mg/dL (ref 0.0–1.2)
Total Protein: 7 g/dL (ref 6.5–8.1)

## 2023-09-05 LAB — URINALYSIS, ROUTINE W REFLEX MICROSCOPIC
Bacteria, UA: NONE SEEN
Bilirubin Urine: NEGATIVE
Glucose, UA: NEGATIVE mg/dL
Ketones, ur: NEGATIVE mg/dL
Leukocytes,Ua: NEGATIVE
Nitrite: NEGATIVE
Protein, ur: NEGATIVE mg/dL
Specific Gravity, Urine: 1.006 (ref 1.005–1.030)
pH: 5 (ref 5.0–8.0)

## 2023-09-05 LAB — SURGICAL PCR SCREEN
MRSA, PCR: NEGATIVE
Staphylococcus aureus: NEGATIVE

## 2023-09-05 LAB — CBC
HCT: 37.6 % (ref 36.0–46.0)
Hemoglobin: 12.4 g/dL (ref 12.0–15.0)
MCH: 31 pg (ref 26.0–34.0)
MCHC: 33 g/dL (ref 30.0–36.0)
MCV: 94 fL (ref 80.0–100.0)
Platelets: 234 10*3/uL (ref 150–400)
RBC: 4 MIL/uL (ref 3.87–5.11)
RDW: 13.1 % (ref 11.5–15.5)
WBC: 6.2 10*3/uL (ref 4.0–10.5)
nRBC: 0 % (ref 0.0–0.2)

## 2023-09-05 LAB — SEDIMENTATION RATE: Sed Rate: 34 mm/h — ABNORMAL HIGH (ref 0–30)

## 2023-09-05 LAB — C-REACTIVE PROTEIN: CRP: <0.5 mg/dL (ref <1.0)

## 2023-09-05 NOTE — Patient Instructions (Addendum)
 Your procedure is scheduled on: Monday 09/19/23 To find out your arrival time, please call [phone] between 1PM - 3PM on:   Friday 09/16/23 Report to the Registration Desk on the 1st floor of the [facility]. Free Valet parking is available.  If your arrival time is 6:00 am, do not arrive before that time as the [facility] entrance doors do not open until 6:00 am.  REMEMBER: Instructions that are not followed completely may result in serious medical risk, up to and including death; or upon the discretion of your surgeon and anesthesiologist your surgery may need to be rescheduled.  Do not eat food after midnight the night before surgery.  No gum chewing or hard candies.  You may however, drink CLEAR liquids up to 2 hours before you are scheduled to arrive for your surgery. Do not drink anything within 2 hours of your scheduled arrival time.  Clear liquids include: - water  - apple juice without pulp - gatorade (not RED colors) - black coffee or tea (Do NOT add milk or creamers to the coffee or tea) Do NOT drink anything that is not on this list.  Type 1 and Type 2 diabetics should only drink water.  In addition, your doctor has ordered for you to drink the provided:  Ensure Pre-Surgery Clear Carbohydrate Drink  Drinking this carbohydrate drink up to two hours before surgery helps to reduce insulin resistance and improve patient outcomes. Please complete drinking 2 hours before scheduled arrival time.  One week prior to surgery: Stop Anti-inflammatories (NSAIDS) such as Advil, Aleve, Ibuprofen, Motrin, Naproxen, Naprosyn and Aspirin  based products such as Excedrin, Goody's Powder, BC Powder. Hold Meloxicam for 7 days before surgery You may however, continue to take Tylenol  if needed for pain up until the day of surgery.  Stop ANY OVER THE COUNTER supplements and vitamins for 7 days until after surgery.  Continue taking all prescribed medications with the exception of the  following: Meloxicam  TAKE ONLY THESE MEDICATIONS THE MORNING OF SURGERY WITH A SIP OF WATER:  carvedilol (COREG) 6.25 MG tablet  ezetimibe (ZETIA) 10 MG tablet  pantoprazole  (PROTONIX ) 40 MG tablet Antacid (take one the night before and one on the morning of surgery - helps to prevent nausea after surgery.)  No Alcohol for 24 hours before or after surgery.  No Smoking including e-cigarettes for 24 hours before surgery.  No chewable tobacco products for at least 6 hours before surgery.  No nicotine patches on the day of surgery.  Do not use any "recreational" drugs for at least a week (preferably 2 weeks) before your surgery.  Please be advised that the combination of cocaine and anesthesia may have negative outcomes, up to and including death. If you test positive for cocaine, your surgery will be cancelled.  On the morning of surgery brush your teeth with toothpaste and water, you may rinse your mouth with mouthwash if you wish. Do not swallow any toothpaste or mouthwash.  Use CHG Soap or wipes as directed on instruction sheet.  Do not wear lotions, powders, or perfumes on the day of surgery.  Do not shave body hair from the neck down 48 hours before surgery.  Wear comfortable clothing (specific to your surgery type) to the hospital.  Do not wear jewelry, make-up, hairpins, clips or nail/toenail polish.  For welded (permanent) jewelry: bracelets, anklets, waist bands, etc.  Please have this removed prior to surgery.  If it is not removed, there is a chance that  hospital personnel will need to cut it off on the day of surgery. Contact lenses, hearing aids and dentures may not be worn into surgery.  Do not bring valuables to the hospital. [facility] is not responsible for any missing/lost belongings or valuables.   Notify your doctor if there is any change in your medical condition (cold, fever, infection).  If you are being discharged the day of surgery, you will not be  allowed to drive home. You will need a responsible individual to drive you home and stay with you for 24 hours after surgery.   If you are taking public transportation, you will need to have a responsible individual with you.  If you are being admitted to the hospital overnight, leave your suitcase in the car. After surgery it may be brought to your room.  In case of increased patient census, it may be necessary for you, the patient, to continue your postoperative care in the Same Day Surgery department.  After surgery, you can help prevent lung complications by doing breathing exercises.  Take deep breaths and cough every 1-2 hours. Your doctor may order a device called an Incentive Spirometer to help you take deep breaths. When coughing or sneezing, hold a pillow firmly against your incision with both hands. This is called "splinting." Doing this helps protect your incision. It also decreases belly discomfort.  Surgery Visitation Policy:  Patients undergoing a surgery or procedure may have two family members or support persons with them as long as the person is not COVID-19 positive or experiencing its symptoms.   Inpatient Visitation:    Visiting hours are 7 a.m. to 8 p.m. Up to four visitors are allowed at one time in a patient room. The visitors may rotate out with other people during the day. One designated support person (adult) may remain overnight.  Please call the Pre-admissions Testing Dept. at [phone] if you have any questions about these instructions.    Pre-operative 5 CHG Bath Instructions   You can play a key role in reducing the risk of infection after surgery. Your skin needs to be as free of germs as possible. You can reduce the number of germs on your skin by washing with CHG (chlorhexidine gluconate) soap before surgery. CHG is an antiseptic soap that kills germs and continues to kill germs even after washing.   DO NOT use if you have an allergy to  chlorhexidine/CHG or antibacterial soaps. If your skin becomes reddened or irritated, stop using the CHG and notify one of our RNs at [phone].   Please shower with the CHG soap starting 4 days before surgery using the following schedule:   Thursday 09/15/23 - Monday 09/19/23    Please keep in mind the following:  DO NOT shave, including legs and underarms, starting the day of your first shower.   You may shave your face at any point before/day of surgery.  Place clean sheets on your bed the day you start using CHG soap. Use a clean washcloth (not used since being washed) for each shower. DO NOT sleep with pets once you start using the CHG.   CHG Shower Instructions:  If you choose to wash your hair and private area, wash first with your normal shampoo/soap.  After you use shampoo/soap, rinse your hair and body thoroughly to remove shampoo/soap residue.  Turn the water OFF and apply about 3 tablespoons (45 ml) of CHG soap to a CLEAN washcloth.  Apply CHG soap ONLY FROM YOUR  NECK DOWN TO YOUR TOES (washing for 3-5 minutes)  DO NOT use CHG soap on face, private areas, open wounds, or sores.  Pay special attention to the area where your surgery is being performed.  If you are having back surgery, having someone wash your back for you may be helpful. Wait 2 minutes after CHG soap is applied, then you may rinse off the CHG soap.  Pat dry with a clean towel  Put on clean clothes/pajamas   If you choose to wear lotion, please use ONLY the CHG-compatible lotions on the back of this paper.     Additional instructions for the day of surgery: DO NOT APPLY any lotions, deodorants, cologne, or perfumes.   Put on clean/comfortable clothes.  Brush your teeth.  Ask your nurse before applying any prescription medications to the skin.      CHG Compatible Lotions   Aveeno Moisturizing lotion  Cetaphil Moisturizing Cream  Cetaphil Moisturizing Lotion  Clairol Herbal Essence Moisturizing  Lotion, Dry Skin  Clairol Herbal Essence Moisturizing Lotion, Extra Dry Skin  Clairol Herbal Essence Moisturizing Lotion, Normal Skin  Curel Age Defying Therapeutic Moisturizing Lotion with Alpha Hydroxy  Curel Extreme Care Body Lotion  Curel Soothing Hands Moisturizing Hand Lotion  Curel Therapeutic Moisturizing Cream, Fragrance-Free  Curel Therapeutic Moisturizing Lotion, Fragrance-Free  Curel Therapeutic Moisturizing Lotion, Original Formula  Eucerin Daily Replenishing Lotion  Eucerin Dry Skin Therapy Plus Alpha Hydroxy Crme  Eucerin Dry Skin Therapy Plus Alpha Hydroxy Lotion  Eucerin Original Crme  Eucerin Original Lotion  Eucerin Plus Crme Eucerin Plus Lotion  Eucerin TriLipid Replenishing Lotion  Keri Anti-Bacterial Hand Lotion  Keri Deep Conditioning Original Lotion Dry Skin Formula Softly Scented  Keri Deep Conditioning Original Lotion, Fragrance Free Sensitive Skin Formula  Keri Lotion Fast Absorbing Fragrance Free Sensitive Skin Formula  Keri Lotion Fast Absorbing Softly Scented Dry Skin Formula  Keri Original Lotion  Keri Skin Renewal Lotion Keri Silky Smooth Lotion  Keri Silky Smooth Sensitive Skin Lotion  Nivea Body Creamy Conditioning Oil  Nivea Body Extra Enriched Lotion  Nivea Body Original Lotion  Nivea Body Sheer Moisturizing Lotion Nivea Crme  Nivea Skin Firming Lotion  NutraDerm 30 Skin Lotion  NutraDerm Skin Lotion  NutraDerm Therapeutic Skin Cream  NutraDerm Therapeutic Skin Lotion  ProShield Protective Hand Cream  Provon moisturizing lotion  How to Use an Incentive Spirometer  An incentive spirometer is a tool that measures how well you are filling your lungs with each breath. Learning to take long, deep breaths using this tool can help you keep your lungs clear and active. This may help to reverse or lessen your chance of developing breathing (pulmonary) problems, especially infection. You may be asked to use a spirometer: After a surgery. If you  have a lung problem or a history of smoking. After a long period of time when you have been unable to move or be active. If the spirometer includes an indicator to show the highest number that you have reached, your health care provider or respiratory therapist will help you set a goal. Keep a log of your progress as told by your health care provider. What are the risks? Breathing too quickly may cause dizziness or cause you to pass out. Take your time so you do not get dizzy or light-headed. If you are in pain, you may need to take pain medicine before doing incentive spirometry. It is harder to take a deep breath if you are having pain. How to use your  incentive spirometer  Sit up on the edge of your bed or on a chair. Hold the incentive spirometer so that it is in an upright position. Before you use the spirometer, breathe out normally. Place the mouthpiece in your mouth. Make sure your lips are closed tightly around it. Breathe in slowly and as deeply as you can through your mouth, causing the piston or the ball to rise toward the top of the chamber. Hold your breath for 3-5 seconds, or for as long as possible. If the spirometer includes a coach indicator, use this to guide you in breathing. Slow down your breathing if the indicator goes above the marked areas. Remove the mouthpiece from your mouth and breathe out normally. The piston or ball will return to the bottom of the chamber. Rest for a few seconds, then repeat the steps 10 or more times. Take your time and take a few normal breaths between deep breaths so that you do not get dizzy or light-headed. Do this every 1-2 hours when you are awake. If the spirometer includes a goal marker to show the highest number you have reached (best effort), use this as a goal to work toward during each repetition. After each set of 10 deep breaths, cough a few times. This will help to make sure that your lungs are clear. If you have an incision on  your chest or abdomen from surgery, place a pillow or a rolled-up towel firmly against the incision when you cough. This can help to reduce pain while taking deep breaths and coughing. General tips When you are able to get out of bed: Walk around often. Continue to take deep breaths and cough in order to clear your lungs. Keep using the incentive spirometer until your health care provider says it is okay to stop using it. If you have been in the hospital, you may be told to keep using the spirometer at home. Contact a health care provider if: You are having difficulty using the spirometer. You have trouble using the spirometer as often as instructed. Your pain medicine is not giving enough relief for you to use the spirometer as told. You have a fever. Get help right away if: You develop shortness of breath. You develop a cough with bloody mucus from the lungs. You have fluid or blood coming from an incision site after you cough. Summary An incentive spirometer is a tool that can help you learn to take long, deep breaths to keep your lungs clear and active. You may be asked to use a spirometer after a surgery, if you have a lung problem or a history of smoking, or if you have been inactive for a long period of time. Use your incentive spirometer as instructed every 1-2 hours while you are awake. If you have an incision on your chest or abdomen, place a pillow or a rolled-up towel firmly against your incision when you cough. This will help to reduce pain. Get help right away if you have shortness of breath, you cough up bloody mucus, or blood comes from your incision when you cough. This information is not intended to replace advice given to you by your health care provider. Make sure you discuss any questions you have with your health care provider. Document Revised: 07/09/2019 Document Reviewed: 07/09/2019 Elsevier Patient Education  2023 [employer]  Preoperative Educational Videos for  Total Hip, Knee and Shoulder Replacements  To better prepare for surgery, please view our videos that explain the physical  activity and discharge planning required to have the best surgical recovery at [facility].  [email or web address]  Questions? Call [phone] or email [email or web address]

## 2023-09-05 NOTE — Pre-Procedure Instructions (Signed)
 Patient had fall while on vacation presents with healing abrasion to left knee.and bruising to left hip with various stages of healing noted. Some areas of warmth noted along with tightness under skin. Other areas normal temp and soft. Patient is scheduled to see ortho 09/06/23 for pre op H&P and has been instructed to make them aware and evaluate. This is not on her surgical side

## 2023-09-05 NOTE — Progress Notes (Signed)
 Perioperative Services Pre-Admission/Anesthesia Testing    Date: 09/05/23  Name: Veronica Cisneros MRN:   [id number]  Re: Hip pain s/p mechanical fall  Planned Surgical Procedure(s):    Case: [id number] Date/Time: 09/19/23 0700   Procedure: ARTHROPLASTY, KNEE, TOTAL, USING IMAGELESS COMPUTER-ASSISTED NAVIGATION (Right: Knee)   Anesthesia type: Choice   Diagnosis: Primary osteoarthritis of right knee [M17.11]   Pre-op diagnosis: PRIMARY OSTEOARTHRITIS OF RIGHT KNEE.   Location: [facility] OR ROOM 01 / [facility]S FOR ANESTHES[facility]   Surgeons: Arlyne Lame, MD      Clinical Notes:  Patient is scheduled for the above procedure on 09/19/2023 with Dr. Alveria Johann, MD.  Patient presented to the PAT clinic on 09/05/2023 for preoperative interview and testing.  During her interview, patient noted that she experienced a mechanical fall on 08/30/2023.  Patient with complaints of abrasion to her LEFT knee and pain with significant bruising to her LEFT hip.  Patient with overlying hematoma noted.  Patient able to ambulate difficulties beyond her reported pain.  She has been using APAP + prescribed meloxicam for the aforementioned pain.  Of note, patient scheduled for contralateral (RIGHT) knee arthroplasty on 09/19/2023. She is concerned that there is a potential hip injury that will prevent her from undergoing her planned knee arthroplasty. Patient is scheduled to see orthopedics for preoperative visit tomorrow (09/06/2023).   Impression and Plan:  Veronica Cisneros is scheduled for RIGHT knee arthroplasty on 09/19/2023.  Patient status post mechanical fall on 08/30/2023.  She is concerned about potential underlying osseous injury that will prevent her from undergoing knee arthroplasty.  In efforts to further evaluate patient's complaints of pain, will obtain diagnostic plain films today to further evaluate.  Patient has a past medical history significant for postmenopausal osteopenia.  Most recent bone  density performed on 07/09/2023 suggestive of a ten-year fracture probability by FRAX of 3.5% or greater for hip fracture or 19% or greater for major osteoporotic fracture.  Obtaining films today will allow for patient to discuss radiographs with orthopedic provider during her appointment tomorrow without having to wait on radiographs to be performed.  Patient was advised to keep the abrasion on her LEFT knee clean and dry.  She was asked to monitor for signs and symptoms of infection including erythema, swelling, and any type of drainage.  These concerns would need to be promptly reported to Dr. Aubry Blase.  Sending copy of note with imaging results over to orthopedic surgeon for review once results received.  No further needs from PAT APP at this time.  ADDENDUM 09/05/2023 at 1600 PM: Results from diagnostic radiographs reviewed as follows:     CLINICAL DATA:  Left hip pain after fall. DG HIP (WITH OR WITHOUT PELVIS) 2-3V LEFT IMPRESSION: No acute osseous abnormality. Mild-to-moderate degenerative changes of the left hip. Partially evaluated serpiginous sclerotic change of the right femoral head is again noted, most compatible with AVN.  Radiographs reveal no evidence of acute fracture or dislocation.  There were mild to moderate degenerative changes in the left hip with superior joint space narrowing and osteophytosis.  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  Forwarding result to patient's orthopedic provider for further evaluation and review.   Veronica Caroline, MSN, APRN, FNP-C, CEN [facility]  Perioperative Services Nurse Practitioner Phone: [phone] Fax: [phone] 09/05/23 3:41 PM  NOTE: This note has been prepared using Dragon dictation software. Despite my best ability  to proofread, there is always the potential that unintentional transcriptional errors may still occur from this  process.

## 2023-09-18 ENCOUNTER — Encounter: Payer: Self-pay | Admitting: Orthopedic Surgery

## 2023-09-18 DIAGNOSIS — M1711 Unilateral primary osteoarthritis, right knee: Secondary | ICD-10-CM | POA: Insufficient documentation

## 2023-09-18 NOTE — H&P (Signed)
 ORTHOPAEDIC HISTORY & PHYSICAL Luz Sames, [place] - 09/06/2023 10:15 AM EDT Formatting of this note is different from the original. NAME: NALA KACHEL H&P Date: 09/06/2023 Procedure Date: 09/19/2023  Chief Complaint: right knee pain, swelling, and giving way  HPI LAMEKA DISLA is a 73 y.o. female who has severe Right knee pain. Patient reports a longstanding history of right knee discomfort that has significantly gotten worse over the last 2 and half years. She had previously undergone a right total knee arthroscopy procedure performed by Dr. 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 As expected, the patient localizes most of her discomfort to this medial aspect of her knee. She states that the pain is made worse with any prolonged standing, ambulation and routine daily activities. It greatly affects her ability to ambulate long distances and perform her ADLs that she would normally like. She has failed conservative treatment including Tylenol , NSAIDs, tramadol, intra-articular corticosteroid and viscosupplementation injections. She is not currently utilizing any ambulatory aids . She has requested operative intervention for relief of her DJD symptoms. Patient does admit to having a history of mild aortic stenosis previously diagnosed and worked up by her cardiologist. She denies any pulmonary issues. Patient denies any clots or DVTs. She is not a diabetic. She does report as noted above, a previous surgery on her right, operative knee.  Social Hx: Patient lives at home with her husband. She is retired. She denies any illicit drug use, smoking or nicotine use. She does admit to alcohol use, states that she drinks every day, on average 2-3 standard drinks a day.  Medications & Allergies Allergies: Allergies Allergen Reactions Hydrochlorothiazide  Other (See  Comments) Hypokalemia Norvasc [Amlodipine] Swelling Leg Edema Other Other (See Comments) Patient says she "starts shaking" when awakening from general anesthesia.  General anesthesia - causes shaking Triamterene-Hydrochlorothiazid Other (See Comments) Hypotension Cephalexin Nausea Ciprofloxacin Diarrhea Diazepam Anxiety and Other (See Comments) Pt states valium makes her "hyper"  Hyperactivity Jardiance [Empagliflozin] Other (See Comments) Fatigue Ketek [Telithromycin] Nausea and Vomiting Spironolactone Nausea  Home Medicines: Current Outpatient Medications on File Prior to Visit Medication Sig Dispense Refill ACETAMINOPHEN  (TYLENOL  ARTHRITIS ORAL) Take 1,300 mg by mouth 2 (two) times daily ascorbic acid  (VITAMIN C ) 100 MG tablet Take 400 mg by mouth once daily as needed carvediloL (COREG) 6.25 MG tablet Take 1 tablet (6.25 mg total) by mouth 2 (two) times daily with meals 180 tablet 3 cholecalciferol (VITAMIN D3) 1,000 unit capsule Take 1,000 Units by mouth 2 (two) times daily.  clobetasoL (CORMAX) 0.05 % external solution Apply topically 2 (two) times daily clotrimazole (LOTRIMIN) 1 % cream Apply 1 Application topically 2 (two) times daily as needed ((heat rash) cyanocobalamin (VITAMIN B12) 1000 MCG tablet Take 1,000 mcg by mouth once daily folic acid  (FOLVITE ) 1 MG tablet Take 2 mg by mouth once daily FUROsemide (LASIX) 20 MG tablet Take 1 tablet (20 mg total) by mouth 3 (three) times daily (Patient taking differently: Take 20 mg by mouth every other day) 270 tablet 1 gabapentin  (NEURONTIN ) 300 MG capsule TAKE 1 CAPSULE BY MOUTH THREE TIMES A DAY 270 capsule 1 loperamide (IMODIUM) 2 mg capsule TAKE 1 CAPSULE BY MOUTH 4 TIMES DAILY AS NEEDED FOR DIARRHEA. (NOT COVERED) 90 capsule 1 meloxicam (MOBIC) 15 MG tablet Take 1 tablet (15 mg total) by mouth once daily as needed for Pain 90 tablet 3 nystatin (MYCOSTATIN) 100,000 unit/gram cream  Apply topically 2 (two) times daily as  needed And powder 30 g 5 pantoprazole  (PROTONIX ) 40 MG DR tablet take 1 tablet by mouth every day 90 tablet 3 potassium chloride  (KLOR-CON ) 10 MEQ ER tablet TAKE 1 TABLET (10 MEQ TOTAL) BY MOUTH ONCE DAILY AS NEEDED (WITH FUROSEMIDE) 90 tablet 3 prednisoLONE  (MILLIPRED) 5 mg tablet Take 5-25 mg by mouth once daily as needed (Take 25 mg on day 1, then reduce by one tablet daily until done, take as needed for mastitis flare) predniSONE  (DELTASONE ) 5 MG tablet Take 1 tablet (5 mg total) by mouth as needed 30 tablet 5 rosuvastatin  (CRESTOR ) 20 MG tablet Take 1 tablet (20 mg total) by mouth once daily 90 tablet 3 triamcinolone 0.1 % ointment FOR IRRITATION UNDER THE BREAST, APPLY TWICE DAILY TO IRRITATED AREAS UNTIL SMOOTH (LIMIT TO 7 DAYS STRAIGHT) valsartan (DIOVAN) 40 MG tablet Take 1 tablet (40 mg total) by mouth 2 (two) times daily 180 tablet 3 cyclobenzaprine (FLEXERIL) 5 MG tablet 1/2-1 po qHS prn 30 tablet 5 traMADoL (ULTRAM) 50 mg tablet 1/2-1 po bid prn 10 tablet 1  No current facility-administered medications on file prior to visit.  Medical / Surgical History  Past Medical History: Diagnosis Date Anemia Mild, iron and B-12 levels normal with mild folate deficiency documented August 2007. Anesthesia complication tremor postop Avascular necrosis of femur, right (CMS/HHS-HCC) 03/17/2018 B12 deficiency 11/14/2015 Breast cancer (CMS/HHS-HCC) 1997 right lumpectomy, followed by radiation and chemotherapy, followed at [facility] DDD (degenerative disc disease) status post diskectomy at L4/L5 in 1997. On partial disability since this, with 30 pound weight lifting restrictions. Diverticulosis DJD (degenerative joint disease) right knee surgery 2012 Fall 08/2005 with rib fractures. Multiple fractures on the left side. Fractures, stress in the feet, followed by Podiatry. Heart murmur Hematuria 2013 CT unremarkable. Urology evaluation and cystoscopy negative. History of blood  transfusion History of chemotherapy History of radiation therapy Hyperlipidemia Hypertension IBS (irritable bowel syndrome) GI evaluation 2013 Thyroid nodule followed by Dr. Donavon Fudge. Biopsy 11/11 benign, consistent with colloid nodule Ulceration of colon determined by endoscopy 11/14/2015 Admitted 7/17 for GI bleeding after colonoscopy/polypectomy; 1 cm distal colonic ulcer noted on colonoscopy, thought to be related to recent polypectomy Vitamin D deficiency 11/21/2013   Past Surgical History: Procedure Laterality Date Diskectomy L4 L5 1997 BREAST LUMPECTOMY Right 1997 KNEE ARTHROSCOPY Right 12/2010 partial meniscectomy, medial chondroplasty. COLONOSCOPY W/REMOVAL LESIONS BY SNARE N/A 11/03/2015 Procedure: Colonoscopy ; Surgeon: Carin Charleston, MD; Location: [facility]; Service: Gastroenterology; Laterality: N/A; FLEXIBLE SIGMOIDOSCOPY N/A 11/06/2015 Procedure: Flexible Sigmoidoscopy; Surgeon: Charlynn Coombe, MD; Location: [facility]; Service: Gastroenterology; Laterality: N/A; COLONOSCOPY W/CONTROL BLEEDING 11/07/2015 Procedure: COLONOSCOPY, FLEXIBLE; WITH CONTROL OF BLEEDING, ANY METHOD; Surgeon: Charlynn Coombe, MD; Location: [facility]; Service: Gastroenterology;; COLONOSCOPY N/A 12/10/2016 Procedure: Colon MAC; Surgeon: Theodore Fisher, MD; Location: [facility]; Service: Gastroenterology; Laterality: N/A; LAPAROSCOPIC LYMPHADENECTOMY PELVIC N/A 03/21/2018 Procedure: ROBOT ASSISTED LAPAROSCOPY, WITH excisional biopsy of retroperitoneal lymphadenopathy; Surgeon: Jenise Mixer, MD; Location: [facility] OR; Service: General Surgery; Laterality: N/A; CHOLECYSTECTOMY CT biopsy retroperitoneal lymphnode HYSTERECTOMY with BSO OOPHORECTOMY TUBAL LIGATION   Physical Exam  Ht:161.3 cm (5' 3.5") Wt:81.6 kg (179 lb 12.8 oz) BMI: Body mass index is 31.35 kg/m.  General/Constitutional: No apparent distress: well-nourished and well  developed. Eyes: Pupils equal, round with synchronous movement. Lymphatic: No palpable adenopathy. Respiratory: Patient has good chest rise and fall with inspiration and expiration. All lung fields are clear to auscultation bilaterally. There is no Rales, rhonchi or wheezes appreciated.  Cardiovascular: Upon auscultation there is a regular rate and rhythm without any murmurs, rubs, gallops or heaves appreciated. There does not appear to be any swelling down the lower extremities. Posterior tibial pulses appreciated bilaterally, 2+. Integumentary: No impressive skin lesions present, except as noted in detailed exam. Neuro/Psych: Normal mood and affect, oriented to person, place and time. Musculoskeletal: see exam below  Right knee exam Upon inspection of the patient's right knee there does not appear to be any skin changes, open abrasions, swelling or redness. There is a varus alignment. Upon palpation, the patient reports having pain along the medial aspect of their knee. Patient has approximately 4 degrees off of full extension actively with ROM, and able to flex back to 110 with mild pain. Varus and valgus stress testing shows positive pseudolaxity to valgus stressing. The patella tracks well within the femoral groove from flexion into extension with noticeable crepitus. Anterior and posterior drawer testing negative. Patient is neurovascularly intact down their lower extremity to all dermatomes. Posterior tibial pulses appreciated 2+.  Imaging Imaging: None ordered today. Previous images from 07/19/2023 were reviewed of the patient's right knee. These images included AP weightbearing, lateral and sunrise views. There is notably significant narrowing of the medial cartilage space with 100% loss and a noted bone-on-bone articulation. Overall alignment was noted to be relative varus. Subchondral changes were appreciated. Osteophyte formation present. No fractures, lytic lesions or gross deformities  appreciated on films.  Assesment and Plan Knee DJD  I have recommended that Amalia Jung undergo right total knee replacement. Consents has been signed. The risks, benefits, prognosis and alternatives including but not limited to DVT, PE, infection, neurovascular injury, failure of the procedure and death were explained to the patient and she is willing to proceed with surgery as described to her by myself. Plan will be for post operative admission of at least 1 midnight for pain control and PT. She will be managed with DVT prophylaxis, antibiotics preoperatively for 24 hours and aggressive in patient rehab.  Pre, intra and post op interventions were discussed. Patient has good understanding  Medication Reconciliation was performed. Discussed cessation of NSAIDs, vitamins and supplements.  A total of 45 minutes was spent reviewing patient's charts, medical reconciliation, discussing/educating the patient about surgical interventions, and answering any questions provided by the patient.  JOSHUA Alex Hylan, PA [facility] 09/06/2023  Electronically signed by Luz Sames, PA at 09/06/2023 8:37 PM EDT

## 2023-09-19 ENCOUNTER — Other Ambulatory Visit: Payer: Self-pay

## 2023-09-19 ENCOUNTER — Observation Stay

## 2023-09-19 ENCOUNTER — Ambulatory Visit: Payer: Self-pay | Admitting: Urgent Care

## 2023-09-19 ENCOUNTER — Observation Stay
Admission: RE | Admit: 2023-09-19 | Discharge: 2023-09-20 | Disposition: A | Discharge status: Home / Self Care | Source: Ambulatory Visit | Attending: Orthopedic Surgery | Admitting: Orthopedic Surgery

## 2023-09-19 ENCOUNTER — Encounter
Admission: RE | Disposition: A | Discharge status: Home / Self Care | Payer: Self-pay | Source: Ambulatory Visit | Attending: Orthopedic Surgery

## 2023-09-19 ENCOUNTER — Encounter: Payer: Self-pay | Admitting: Orthopedic Surgery

## 2023-09-19 DIAGNOSIS — D649 Anemia, unspecified: Secondary | ICD-10-CM

## 2023-09-19 DIAGNOSIS — Z853 Personal history of malignant neoplasm of breast: Secondary | ICD-10-CM | POA: Insufficient documentation

## 2023-09-19 DIAGNOSIS — Z79899 Other long term (current) drug therapy: Secondary | ICD-10-CM | POA: Insufficient documentation

## 2023-09-19 DIAGNOSIS — Z96651 Presence of right artificial knee joint: Secondary | ICD-10-CM

## 2023-09-19 DIAGNOSIS — I10 Essential (primary) hypertension: Secondary | ICD-10-CM | POA: Diagnosis not present

## 2023-09-19 DIAGNOSIS — Z85828 Personal history of other malignant neoplasm of skin: Secondary | ICD-10-CM | POA: Insufficient documentation

## 2023-09-19 DIAGNOSIS — E538 Deficiency of other specified B group vitamins: Secondary | ICD-10-CM

## 2023-09-19 DIAGNOSIS — M7989 Other specified soft tissue disorders: Secondary | ICD-10-CM

## 2023-09-19 DIAGNOSIS — M1711 Unilateral primary osteoarthritis, right knee: Secondary | ICD-10-CM | POA: Diagnosis present

## 2023-09-19 HISTORY — DX: Other intervertebral disc degeneration, lumbar region without mention of lumbar back pain or lower extremity pain: M51.369

## 2023-09-19 HISTORY — DX: Other specified disorders of bone density and structure, unspecified site: M85.80

## 2023-09-19 HISTORY — DX: Follicular lymphoma, unspecified, unspecified site: C82.90

## 2023-09-19 HISTORY — DX: Idiopathic aseptic necrosis of right femur: M87.051

## 2023-09-19 HISTORY — PX: KNEE ARTHROPLASTY: SHX992

## 2023-09-19 HISTORY — DX: Diverticulosis of intestine, part unspecified, without perforation or abscess without bleeding: K57.90

## 2023-09-19 SURGERY — ARTHROPLASTY, KNEE, TOTAL, USING IMAGELESS COMPUTER-ASSISTED NAVIGATION
Anesthesia: Spinal | Site: Knee | Laterality: Right

## 2023-09-19 MED ORDER — PANTOPRAZOLE SODIUM 40 MG PO TBEC
40.0000 mg | DELAYED_RELEASE_TABLET | Freq: Two times a day (BID) | ORAL | Status: DC
  Administered 2023-09-19 – 2023-09-20 (×3): 40 mg via ORAL
  Filled 2023-09-19 (×3): qty 1

## 2023-09-19 MED ORDER — BUPIVACAINE LIPOSOME 1.3 % IJ SUSP
INTRAMUSCULAR | Status: AC
  Filled 2023-09-19: qty 40

## 2023-09-19 MED ORDER — SENNOSIDES-DOCUSATE SODIUM 8.6-50 MG PO TABS
1.0000 | ORAL_TABLET | Freq: Two times a day (BID) | ORAL | Status: DC
  Administered 2023-09-19 – 2023-09-20 (×2): 1 via ORAL
  Filled 2023-09-19 (×2): qty 1

## 2023-09-19 MED ORDER — CARVEDILOL 3.125 MG PO TABS
6.2500 mg | ORAL_TABLET | Freq: Two times a day (BID) | ORAL | Status: DC
  Administered 2023-09-19 – 2023-09-20 (×2): 6.25 mg via ORAL
  Filled 2023-09-19 (×2): qty 2

## 2023-09-19 MED ORDER — DROPERIDOL 2.5 MG/ML IJ SOLN: 0.6250 mg | Freq: Once | INTRAMUSCULAR | Status: DC | PRN

## 2023-09-19 MED ORDER — PROPOFOL 1000 MG/100ML IV EMUL
INTRAVENOUS | Status: AC
  Filled 2023-09-19: qty 100

## 2023-09-19 MED ORDER — CELECOXIB 200 MG PO CAPS
ORAL_CAPSULE | ORAL | Status: AC
  Filled 2023-09-19: qty 1

## 2023-09-19 MED ORDER — TRANEXAMIC ACID-NACL 1000-0.7 MG/100ML-% IV SOLN
INTRAVENOUS | Status: AC
  Filled 2023-09-19: qty 100

## 2023-09-19 MED ORDER — ROSUVASTATIN CALCIUM 20 MG PO TABS
10.0000 mg | ORAL_TABLET | Freq: Every day | ORAL | Status: DC
  Administered 2023-09-19: 10 mg via ORAL
  Filled 2023-09-19: qty 1

## 2023-09-19 MED ORDER — OXYCODONE HCL 5 MG PO TABS: 10.0000 mg | ORAL_TABLET | ORAL | Status: DC | PRN

## 2023-09-19 MED ORDER — SODIUM CHLORIDE 0.9 % IR SOLN
Status: DC | PRN
  Administered 2023-09-19: 3000 mL

## 2023-09-19 MED ORDER — FENTANYL CITRATE (PF) 100 MCG/2ML IJ SOLN
INTRAMUSCULAR | Status: AC
  Filled 2023-09-19: qty 2

## 2023-09-19 MED ORDER — TRAMADOL HCL 50 MG PO TABS
50.0000 mg | ORAL_TABLET | ORAL | Status: DC | PRN
  Administered 2023-09-19: 50 mg via ORAL
  Filled 2023-09-19: qty 1

## 2023-09-19 MED ORDER — METOCLOPRAMIDE HCL 10 MG PO TABS
10.0000 mg | ORAL_TABLET | Freq: Three times a day (TID) | ORAL | Status: DC
  Administered 2023-09-19 – 2023-09-20 (×3): 10 mg via ORAL
  Filled 2023-09-19 (×3): qty 1

## 2023-09-19 MED ORDER — FENTANYL CITRATE (PF) 100 MCG/2ML IJ SOLN: 25.0000 ug | INTRAMUSCULAR | Status: DC | PRN

## 2023-09-19 MED ORDER — FENTANYL CITRATE (PF) 100 MCG/2ML IJ SOLN
INTRAMUSCULAR | Status: DC | PRN
  Administered 2023-09-19: 25 ug via INTRAVENOUS
  Administered 2023-09-19: 75 ug via INTRAVENOUS

## 2023-09-19 MED ORDER — TRANEXAMIC ACID-NACL 1000-0.7 MG/100ML-% IV SOLN
1000.0000 mg | Freq: Once | INTRAVENOUS | Status: AC
  Administered 2023-09-19: 1000 mg via INTRAVENOUS

## 2023-09-19 MED ORDER — EZETIMIBE 10 MG PO TABS
10.0000 mg | ORAL_TABLET | Freq: Every day | ORAL | Status: DC
  Administered 2023-09-20: 10 mg via ORAL

## 2023-09-19 MED ORDER — DEXAMETHASONE SODIUM PHOSPHATE 10 MG/ML IJ SOLN
INTRAMUSCULAR | Status: AC
  Filled 2023-09-19: qty 1

## 2023-09-19 MED ORDER — PHENOL 1.4 % MT LIQD: 1.0000 | OROMUCOSAL | Status: DC | PRN

## 2023-09-19 MED ORDER — OXYCODONE HCL 5 MG PO TABS
5.0000 mg | ORAL_TABLET | Freq: Once | ORAL | Status: AC | PRN
  Administered 2023-09-19: 5 mg via ORAL

## 2023-09-19 MED ORDER — OXYCODONE HCL 5 MG PO TABS: 5.0000 mg | ORAL_TABLET | ORAL | Status: DC | PRN

## 2023-09-19 MED ORDER — CEFAZOLIN SODIUM-DEXTROSE 2-4 GM/100ML-% IV SOLN
INTRAVENOUS | Status: AC
  Filled 2023-09-19: qty 100

## 2023-09-19 MED ORDER — CEFAZOLIN SODIUM-DEXTROSE 2-4 GM/100ML-% IV SOLN
2.0000 g | Freq: Four times a day (QID) | INTRAVENOUS | Status: AC
  Administered 2023-09-19 (×2): 2 g via INTRAVENOUS
  Filled 2023-09-19 (×2): qty 100

## 2023-09-19 MED ORDER — HYDROMORPHONE HCL 1 MG/ML IJ SOLN: 0.5000 mg | INTRAMUSCULAR | Status: DC | PRN

## 2023-09-19 MED ORDER — BUPIVACAINE HCL (PF) 0.25 % IJ SOLN
INTRAMUSCULAR | Status: AC
  Filled 2023-09-19: qty 120

## 2023-09-19 MED ORDER — CEFAZOLIN SODIUM-DEXTROSE 2-4 GM/100ML-% IV SOLN
2.0000 g | INTRAVENOUS | Status: AC
  Administered 2023-09-19: 2 g via INTRAVENOUS

## 2023-09-19 MED ORDER — CHLORHEXIDINE GLUCONATE 4 % EX SOLN
60.0000 mL | Freq: Once | CUTANEOUS | Status: AC
  Administered 2023-09-19: 4 via TOPICAL

## 2023-09-19 MED ORDER — FERROUS SULFATE 325 (65 FE) MG PO TABS
325.0000 mg | ORAL_TABLET | Freq: Two times a day (BID) | ORAL | Status: DC
  Administered 2023-09-19 – 2023-09-20 (×2): 325 mg via ORAL
  Filled 2023-09-19 (×2): qty 1

## 2023-09-19 MED ORDER — BISACODYL 10 MG RE SUPP: 10.0000 mg | Freq: Every day | RECTAL | Status: DC | PRN

## 2023-09-19 MED ORDER — EPHEDRINE 5 MG/ML INJ
INTRAVENOUS | Status: AC
  Filled 2023-09-19: qty 5

## 2023-09-19 MED ORDER — MAGNESIUM HYDROXIDE 400 MG/5ML PO SUSP
30.0000 mL | Freq: Every day | ORAL | Status: DC
  Administered 2023-09-20: 30 mL via ORAL
  Filled 2023-09-19: qty 30

## 2023-09-19 MED ORDER — ASPIRIN 81 MG PO CHEW
81.0000 mg | CHEWABLE_TABLET | Freq: Two times a day (BID) | ORAL | Status: DC
  Administered 2023-09-19 – 2023-09-20 (×3): 81 mg via ORAL
  Filled 2023-09-19 (×3): qty 1

## 2023-09-19 MED ORDER — ONDANSETRON HCL 4 MG PO TABS: 4.0000 mg | ORAL_TABLET | Freq: Four times a day (QID) | ORAL | Status: DC | PRN

## 2023-09-19 MED ORDER — SODIUM CHLORIDE 0.9 % IV SOLN: INTRAVENOUS | Status: DC

## 2023-09-19 MED ORDER — DEXAMETHASONE SODIUM PHOSPHATE 10 MG/ML IJ SOLN
8.0000 mg | Freq: Once | INTRAMUSCULAR | Status: AC
  Administered 2023-09-19: 8 mg via INTRAVENOUS

## 2023-09-19 MED ORDER — OXYCODONE HCL 5 MG/5ML PO SOLN: 5.0000 mg | Freq: Once | ORAL | Status: AC | PRN

## 2023-09-19 MED ORDER — BUPIVACAINE HCL (PF) 0.5 % IJ SOLN
INTRAMUSCULAR | Status: DC | PRN
  Administered 2023-09-19: 3 mL via INTRATHECAL

## 2023-09-19 MED ORDER — MENTHOL 3 MG MT LOZG: 1.0000 | LOZENGE | OROMUCOSAL | Status: DC | PRN

## 2023-09-19 MED ORDER — TRANEXAMIC ACID-NACL 1000-0.7 MG/100ML-% IV SOLN
1000.0000 mg | INTRAVENOUS | Status: AC
  Administered 2023-09-19: 1000 mg via INTRAVENOUS

## 2023-09-19 MED ORDER — CELECOXIB 200 MG PO CAPS
400.0000 mg | ORAL_CAPSULE | Freq: Once | ORAL | Status: AC
Start: 2023-09-19 — End: 2023-09-19
  Administered 2023-09-19: 400 mg via ORAL

## 2023-09-19 MED ORDER — PHENYLEPHRINE 80 MCG/ML (10ML) SYRINGE FOR IV PUSH (FOR BLOOD PRESSURE SUPPORT)
PREFILLED_SYRINGE | INTRAVENOUS | Status: DC | PRN
  Administered 2023-09-19 (×6): 80 ug via INTRAVENOUS

## 2023-09-19 MED ORDER — ALUM & MAG HYDROXIDE-SIMETH 200-200-20 MG/5ML PO SUSP: 30.0000 mL | ORAL | Status: DC | PRN

## 2023-09-19 MED ORDER — CELECOXIB 200 MG PO CAPS
200.0000 mg | ORAL_CAPSULE | Freq: Two times a day (BID) | ORAL | Status: DC
  Administered 2023-09-19 – 2023-09-20 (×2): 200 mg via ORAL
  Filled 2023-09-19 (×2): qty 1

## 2023-09-19 MED ORDER — FOLIC ACID 1 MG PO TABS
2.0000 mg | ORAL_TABLET | Freq: Every day | ORAL | Status: DC
  Administered 2023-09-20: 2 mg via ORAL
  Filled 2023-09-19: qty 2

## 2023-09-19 MED ORDER — ACETAMINOPHEN 325 MG PO TABS: 325.0000 mg | ORAL_TABLET | Freq: Four times a day (QID) | ORAL | Status: DC | PRN

## 2023-09-19 MED ORDER — FLEET ENEMA RE ENEM: 1.0000 | ENEMA | Freq: Once | RECTAL | Status: DC | PRN

## 2023-09-19 MED ORDER — GABAPENTIN 300 MG PO CAPS
ORAL_CAPSULE | ORAL | Status: AC
  Filled 2023-09-19: qty 1

## 2023-09-19 MED ORDER — ENSURE PRE-SURGERY PO LIQD
296.0000 mL | Freq: Once | ORAL | Status: AC
  Administered 2023-09-19: 296 mL via ORAL
  Filled 2023-09-19: qty 296

## 2023-09-19 MED ORDER — ACETAMINOPHEN 10 MG/ML IV SOLN
1000.0000 mg | Freq: Four times a day (QID) | INTRAVENOUS | Status: AC
  Administered 2023-09-19 – 2023-09-20 (×4): 1000 mg via INTRAVENOUS
  Filled 2023-09-19 (×3): qty 100

## 2023-09-19 MED ORDER — FUROSEMIDE 20 MG PO TABS: 20.0000 mg | ORAL_TABLET | ORAL | Status: DC | PRN

## 2023-09-19 MED ORDER — PHENYLEPHRINE 80 MCG/ML (10ML) SYRINGE FOR IV PUSH (FOR BLOOD PRESSURE SUPPORT)
PREFILLED_SYRINGE | INTRAVENOUS | Status: AC
  Filled 2023-09-19: qty 10

## 2023-09-19 MED ORDER — CHLORHEXIDINE GLUCONATE 0.12 % MT SOLN
OROMUCOSAL | Status: AC
  Filled 2023-09-19: qty 15

## 2023-09-19 MED ORDER — ACETAMINOPHEN 10 MG/ML IV SOLN
INTRAVENOUS | Status: AC
  Filled 2023-09-19: qty 100

## 2023-09-19 MED ORDER — BUPIVACAINE HCL (PF) 0.25 % IJ SOLN
INTRAMUSCULAR | Status: DC | PRN
  Administered 2023-09-19: 60 mL

## 2023-09-19 MED ORDER — IRBESARTAN 75 MG PO TABS
75.0000 mg | ORAL_TABLET | Freq: Every day | ORAL | Status: DC
  Administered 2023-09-19 – 2023-09-20 (×2): 75 mg via ORAL
  Filled 2023-09-19 (×3): qty 1

## 2023-09-19 MED ORDER — LACTATED RINGERS IV SOLN: INTRAVENOUS | Status: DC | PRN

## 2023-09-19 MED ORDER — MIDAZOLAM HCL 2 MG/2ML IJ SOLN
INTRAMUSCULAR | Status: AC
  Filled 2023-09-19: qty 2

## 2023-09-19 MED ORDER — ACETAMINOPHEN 10 MG/ML IV SOLN
INTRAVENOUS | Status: DC | PRN
  Administered 2023-09-19: 1000 mg via INTRAVENOUS

## 2023-09-19 MED ORDER — SODIUM CHLORIDE 0.9 % IV SOLN
INTRAVENOUS | Status: DC | PRN
  Administered 2023-09-19: 60 mL

## 2023-09-19 MED ORDER — GABAPENTIN 300 MG PO CAPS
300.0000 mg | ORAL_CAPSULE | Freq: Three times a day (TID) | ORAL | Status: DC
  Administered 2023-09-19 – 2023-09-20 (×3): 300 mg via ORAL
  Filled 2023-09-19 (×2): qty 3

## 2023-09-19 MED ORDER — ONDANSETRON HCL 4 MG/2ML IJ SOLN: 4.0000 mg | Freq: Four times a day (QID) | INTRAMUSCULAR | Status: DC | PRN

## 2023-09-19 MED ORDER — OXYCODONE HCL 5 MG PO TABS
ORAL_TABLET | ORAL | Status: AC
  Filled 2023-09-19: qty 1

## 2023-09-19 MED ORDER — SODIUM CHLORIDE (PF) 0.9 % IJ SOLN
INTRAMUSCULAR | Status: AC
  Filled 2023-09-19: qty 80

## 2023-09-19 MED ORDER — BUPIVACAINE HCL (PF) 0.5 % IJ SOLN
INTRAMUSCULAR | Status: AC
  Filled 2023-09-19: qty 10

## 2023-09-19 MED ORDER — PROPOFOL 500 MG/50ML IV EMUL
INTRAVENOUS | Status: DC | PRN
Start: 2023-09-19 — End: 2023-09-19
  Administered 2023-09-19: 100 ug/kg/min via INTRAVENOUS

## 2023-09-19 MED ORDER — LOPERAMIDE HCL 2 MG PO CAPS: 2.0000 mg | ORAL_CAPSULE | ORAL | Status: DC | PRN

## 2023-09-19 MED ORDER — ACETAMINOPHEN 10 MG/ML IV SOLN: 1000.0000 mg | Freq: Once | INTRAVENOUS | Status: DC | PRN

## 2023-09-19 MED ORDER — DIPHENHYDRAMINE HCL 12.5 MG/5ML PO ELIX: 12.5000 mg | ORAL_SOLUTION | ORAL | Status: DC | PRN

## 2023-09-19 MED ORDER — PHENYLEPHRINE HCL-NACL 20-0.9 MG/250ML-% IV SOLN
INTRAVENOUS | Status: AC
  Filled 2023-09-19: qty 250

## 2023-09-19 MED ORDER — SURGIPHOR WOUND IRRIGATION SYSTEM - OPTIME
TOPICAL | Status: DC | PRN
  Administered 2023-09-19: 450 mL

## 2023-09-19 MED ORDER — POTASSIUM CHLORIDE CRYS ER 10 MEQ PO TBCR: 10.0000 meq | EXTENDED_RELEASE_TABLET | ORAL | Status: DC | PRN

## 2023-09-19 MED ORDER — GABAPENTIN 300 MG PO CAPS
300.0000 mg | ORAL_CAPSULE | Freq: Once | ORAL | Status: AC
  Administered 2023-09-19: 300 mg via ORAL

## 2023-09-19 MED ORDER — MIDAZOLAM HCL 5 MG/5ML IJ SOLN
INTRAMUSCULAR | Status: DC | PRN
  Administered 2023-09-19: 2 mg via INTRAVENOUS

## 2023-09-19 SURGICAL SUPPLY — 63 items
ATTUNE PS FEM RT SZ 5 CEM KNEE (Femur) IMPLANT
ATTUNE PSRP INSR SZ 5 10M KNEE (Insert) IMPLANT
BASE TIBIAL ROT PLAT SZ 5 KNEE (Knees) IMPLANT
BATTERY INSTRU NAVIGATION (MISCELLANEOUS) ×8 IMPLANT
BIT DRILL QUICK REL 1/8 2PK SL (BIT) ×2 IMPLANT
BLADE CLIPPER SURG (BLADE) IMPLANT
BLADE SAW 70X12.5 (BLADE) ×2 IMPLANT
BLADE SAW 90X13X1.19 OSCILLAT (BLADE) ×2 IMPLANT
BLADE SAW 90X25X1.19 OSCILLAT (BLADE) ×2 IMPLANT
BRUSH SCRUB EZ PLAIN DRY (MISCELLANEOUS) ×2 IMPLANT
CEMENT HV SMART SET (Cement) IMPLANT
COOLER POLAR GLACIER W/PUMP (MISCELLANEOUS) ×2 IMPLANT
CUFF TRNQT CYL 24X4X16.5-23 (TOURNIQUET CUFF) IMPLANT
CUFF TRNQT CYL 30X4X21-28X (TOURNIQUET CUFF) IMPLANT
DRAPE SHEET LG 3/4 BI-LAMINATE (DRAPES) ×2 IMPLANT
DRSG AQUACEL AG ADV 3.5X14 (GAUZE/BANDAGES/DRESSINGS) ×2 IMPLANT
DRSG MEPILEX SACRM 8.7X9.8 (GAUZE/BANDAGES/DRESSINGS) ×2 IMPLANT
DRSG TEGADERM 4X4.75 (GAUZE/BANDAGES/DRESSINGS) ×2 IMPLANT
DURAPREP 26ML APPLICATOR (WOUND CARE) ×4 IMPLANT
ELECT CAUTERY BLADE 6.4 (BLADE) ×2 IMPLANT
ELECTRODE REM PT RTRN 9FT ADLT (ELECTROSURGICAL) ×2 IMPLANT
EVACUATOR 1/8 PVC DRAIN (DRAIN) ×2 IMPLANT
EX-PIN ORTHOLOCK NAV 4X150 (PIN) ×4 IMPLANT
GAUZE XEROFORM 1X8 LF (GAUZE/BANDAGES/DRESSINGS) ×2 IMPLANT
GLOVE BIOGEL M STRL SZ7.5 (GLOVE) ×12 IMPLANT
GLOVE BIOGEL PI IND STRL 8 (GLOVE) ×2 IMPLANT
GLOVE SRG 8 PF TXTR STRL LF DI (GLOVE) ×2 IMPLANT
GOWN STRL REUS W/ TWL LRG LVL3 (GOWN DISPOSABLE) ×2 IMPLANT
GOWN STRL REUS W/ TWL XL LVL3 (GOWN DISPOSABLE) ×2 IMPLANT
GOWN TOGA ZIPPER T7+ PEEL AWAY (MISCELLANEOUS) ×2 IMPLANT
HOLDER FOLEY CATH W/STRAP (MISCELLANEOUS) ×2 IMPLANT
HOOD PEEL AWAY T7 (MISCELLANEOUS) ×2 IMPLANT
KIT TURNOVER KIT A (KITS) ×2 IMPLANT
KNIFE SCULPS 14X20 (INSTRUMENTS) ×2 IMPLANT
MANIFOLD NEPTUNE II (INSTRUMENTS) ×4 IMPLANT
NDL SPNL 20GX3.5 QUINCKE YW (NEEDLE) ×4 IMPLANT
NEEDLE SPNL 20GX3.5 QUINCKE YW (NEEDLE) ×2 IMPLANT
PACK TOTAL KNEE (MISCELLANEOUS) ×2 IMPLANT
PAD ABD DERMACEA PRESS 5X9 (GAUZE/BANDAGES/DRESSINGS) ×4 IMPLANT
PAD ARMBOARD POSITIONER FOAM (MISCELLANEOUS) ×6 IMPLANT
PAD WRAPON POLAR KNEE (MISCELLANEOUS) ×2 IMPLANT
PATELLA MEDIAL ATTUN 35MM KNEE (Knees) IMPLANT
PENCIL SMOKE EVACUATOR COATED (MISCELLANEOUS) ×2 IMPLANT
PIN DRILL FIX HALF THREAD (BIT) ×4 IMPLANT
PIN FIXATION 1/8DIA X 3INL (PIN) ×2 IMPLANT
SOL .9 NS 3000ML IRR UROMATIC (IV SOLUTION) ×2 IMPLANT
SOLUTION IRRIG SURGIPHOR (IV SOLUTION) ×2 IMPLANT
SPONGE DRAIN TRACH 4X4 STRL 2S (GAUZE/BANDAGES/DRESSINGS) ×2 IMPLANT
STAPLER SKIN PROX 35W (STAPLE) ×2 IMPLANT
STOCKINETTE IMPERV 14X48 (MISCELLANEOUS) ×2 IMPLANT
STOCKINETTE STRL BIAS CUT 8X4 (MISCELLANEOUS) ×2 IMPLANT
STRAP TIBIA SHORT (MISCELLANEOUS) ×2 IMPLANT
SUCTION TUBE FRAZIER 10FR DISP (SUCTIONS) ×2 IMPLANT
SUT VIC AB 0 CT1 36 (SUTURE) ×2 IMPLANT
SUT VIC AB 1 CT1 36 (SUTURE) ×4 IMPLANT
SUT VIC AB 2-0 CT2 27 (SUTURE) ×2 IMPLANT
SYR 30ML LL (SYRINGE) ×4 IMPLANT
TIP FAN IRRIG PULSAVAC PLUS (DISPOSABLE) ×2 IMPLANT
TOWEL OR 17X26 4PK STRL BLUE (TOWEL DISPOSABLE) ×2 IMPLANT
TOWER CARTRIDGE SMART MIX (DISPOSABLE) ×2 IMPLANT
TRAP FLUID SMOKE EVACUATOR (MISCELLANEOUS) ×2 IMPLANT
TRAY FOLEY MTR SLVR 16FR STAT (SET/KITS/TRAYS/PACK) ×2 IMPLANT
WATER STERILE IRR 1000ML POUR (IV SOLUTION) ×2 IMPLANT

## 2023-09-19 NOTE — TOC Progression Note (Signed)
 Transition of Care [facility]) - Progression Note    Patient Details  Name: Veronica Cisneros MRN: [id number] Date of Birth: May 24, 1950  [facility] CM/SW Contact  Alexandra Ice, RN Phone Number: 09/19/2023, 1:46 PM  Clinical Narrative:     Received message from bedside nurse, patient needs RW and BSC. Sent DME orders to Jon with [employer] for patient.        Expected Discharge Plan and Services                                               Social Determinants of Health (SDOH) Interventions SDOH Screenings   Food Insecurity: No Food Insecurity (09/19/2023)  Housing: Low Risk  (09/19/2023)  Transportation Needs: No Transportation Needs (09/19/2023)  Utilities: Not At Risk (09/19/2023)  Financial Resource Strain: Low Risk  (06/02/2023)   Received from [facility]  Social Connections: Moderately Isolated (09/19/2023)  Tobacco Use: Low Risk  (09/19/2023)    Readmission Risk Interventions     No data to display

## 2023-09-19 NOTE — Interval H&P Note (Signed)
 History and Physical Interval Note:  09/19/2023 7:00 AM  Veronica Cisneros  has presented today for surgery, with the diagnosis of PRIMARY OSTEOARTHRITIS OF RIGHT KNEE..  The various methods of treatment have been discussed with the patient and family. After consideration of risks, benefits and other options for treatment, the patient has consented to  Procedure(s): ARTHROPLASTY, KNEE, TOTAL, USING IMAGELESS COMPUTER-ASSISTED NAVIGATION (Right) as a surgical intervention.  The patient's history has been reviewed, patient examined, no change in status, stable for surgery.  I have reviewed the patient's chart and labs.  Questions were answered to the patient's satisfaction.     Veronica Cisneros

## 2023-09-19 NOTE — Op Note (Signed)
 OPERATIVE NOTE  DATE OF SURGERY:  09/19/2023  PATIENT NAME:  Veronica Cisneros   DOB: 01/10/1951  MRN: [id number]  PRE-OPERATIVE DIAGNOSIS: Degenerative arthrosis of the right knee, primary  POST-OPERATIVE DIAGNOSIS:  Same  PROCEDURE:  Right total knee arthroplasty using computer-assisted navigation  SURGEON:  Maxene Span. M.D.  ASSISTANT:  Benjiman Bras, PA-C (present and scrubbed throughout the case, critical for assistance with exposure, retraction, instrumentation, and closure)  ANESTHESIA: spinal  ESTIMATED BLOOD LOSS: 50 mL  FLUIDS REPLACED: 800 mL of crystalloid  TOURNIQUET TIME: 92 minutes  DRAINS: 2 medium Hemovac drains  SOFT TISSUE RELEASES: Anterior cruciate ligament, posterior cruciate ligament, deep medial collateral ligament, patellofemoral ligament  IMPLANTS UTILIZED: DePuy Attune size 5 posterior stabilized femoral component (cemented), size 5 rotating platform tibial component (cemented), 35 mm medialized dome patella (cemented), and a 10 mm stabilized rotating platform polyethylene insert.  INDICATIONS FOR SURGERY: Veronica Cisneros is a 73 y.o. year old female with a long history of progressive knee pain. X-rays demonstrated severe degenerative changes in tricompartmental fashion. The patient had not seen any significant improvement despite conservative nonsurgical intervention. After discussion of the risks and benefits of surgical intervention, the patient expressed understanding of the risks benefits and agree with plans for total knee arthroplasty.   The risks, benefits, and alternatives were discussed at length including but not limited to the risks of infection, bleeding, nerve injury, stiffness, blood clots, the need for revision surgery, cardiopulmonary complications, among others, and they were willing to proceed.  PROCEDURE IN DETAIL: The patient was brought into the operating room and, after adequate spinal anesthesia was achieved, a tourniquet was  placed on the patient's upper thigh. The patient's knee and leg were cleaned and prepped with alcohol and DuraPrep and draped in the usual sterile fashion. A "timeout" was performed as per usual protocol. The lower extremity was exsanguinated using an Esmarch, and the tourniquet was inflated to 300 mmHg. An anterior longitudinal incision was made followed by a standard mid vastus approach. The deep fibers of the medial collateral ligament were elevated in a subperiosteal fashion off of the medial flare of the tibia so as to maintain a continuous soft tissue sleeve. The patella was subluxed laterally and the patellofemoral ligament was incised. Inspection of the knee demonstrated severe degenerative changes with full-thickness loss of articular cartilage. Osteophytes were debrided using a rongeur. Anterior and posterior cruciate ligaments were excised. Two 4.0 mm Schanz pins were inserted in the femur and into the tibia for attachment of the array of trackers used for computer-assisted navigation. Hip center was identified using a circumduction technique. Distal landmarks were mapped using the computer. The distal femur and proximal tibia were mapped using the computer. The distal femoral cutting guide was positioned using computer-assisted navigation so as to achieve a 5 distal valgus cut. The femur was sized and it was felt that a size 5 femoral component was appropriate. A size 5 femoral cutting guide was positioned and the anterior cut was performed and verified using the computer. This was followed by completion of the posterior and chamfer cuts. Femoral cutting guide for the central box was then positioned in the center box cut was performed.  Attention was then directed to the proximal tibia. Medial and lateral menisci were excised. The extramedullary tibial cutting guide was positioned using computer-assisted navigation so as to achieve a 0 varus-valgus alignment and 3 posterior slope. The cut was  performed and verified using the computer. The proximal tibia  was sized and it was felt that a size 5 tibial tray was appropriate. Tibial and femoral trials were inserted followed by insertion of a 10 mm polyethylene insert. This allowed for excellent mediolateral soft tissue balancing both in flexion and in full extension. Finally, the patella was cut and prepared so as to accommodate a 35 mm medialized dome patella. A patella trial was placed and the knee was placed through a range of motion with excellent patellar tracking appreciated. The femoral trial was removed after debridement of posterior osteophytes. The central post-hole for the tibial component was reamed followed by insertion of a keel punch. Tibial trials were then removed. Cut surfaces of bone were irrigated with copious amounts of normal saline using pulsatile lavage and then suctioned dry. Polymethylmethacrylate cement was prepared in the usual fashion using a vacuum mixer. Cement was applied to the cut surface of the proximal tibia as well as along the undersurface of a size 5 rotating platform tibial component. Tibial component was positioned and impacted into place. Excess cement was removed using Personal assistant. Cement was then applied to the cut surfaces of the femur as well as along the posterior flanges of the size 5 femoral component. The femoral component was positioned and impacted into place. Excess cement was removed using Personal assistant. A 10 mm polyethylene trial was inserted and the knee was brought into full extension with steady axial compression applied. Finally, cement was applied to the backside of a 35 mm medialized dome patella and the patellar component was positioned and patellar clamp applied. Excess cement was removed using Personal assistant. After adequate curing of the cement, the tourniquet was deflated after a total tourniquet time of 92 minutes. Hemostasis was achieved using electrocautery. The knee was irrigated with  copious amounts of normal saline using pulsatile lavage followed by 450 ml of Surgiphor and then suctioned dry. 20 mL of 1.3% Exparel  and 60 mL of 0.25% Marcaine  in 40 mL of normal saline was injected along the posterior capsule, medial and lateral gutters, and along the arthrotomy site. A 10 mm stabilized rotating platform polyethylene insert was inserted and the knee was placed through a range of motion with excellent mediolateral soft tissue balancing appreciated and excellent patellar tracking noted. 2 medium drains were placed in the wound bed and brought out through separate stab incisions. The medial parapatellar portion of the incision was reapproximated using interrupted sutures of #1 Vicryl. Subcutaneous tissue was approximated in layers using first #0 Vicryl followed #2-0 Vicryl. The skin was approximated with skin staples. A sterile dressing was applied.  The patient tolerated the procedure well and was transported to the recovery room in stable condition.    Dyan Creelman P. Mazzie Brodrick, Jr., M.D.

## 2023-09-19 NOTE — Evaluation (Signed)
 Physical Therapy Evaluation Patient Details Name: Veronica Cisneros MRN: [id number] DOB: 07-18-1950 Today's Date: 09/19/2023  History of Present Illness  Pt admitted for R TKR and is POD 0 at time of evaluation.  Clinical Impression  Pt is a pleasant 73 year old female who was admitted for R TKR. Pt performs bed mobility/transfers with min assist and ambulation with CGA and RW. Pt demonstrates deficits with strength/pain/mobility. Would benefit from skilled PT to address above deficits and promote optimal return to PLOF. Pt demonstrates ability to perform 10 SLRs with independence, therefore does not require KI for mobility. Pt will continue to receive skilled PT services while admitted and will defer to TOC/care team for updates regarding disposition planning.         If plan is discharge home, recommend the following: A little help with walking and/or transfers;A little help with bathing/dressing/bathroom;Assist for transportation;Help with stairs or ramp for entrance   Can travel by private vehicle        Equipment Recommendations Rolling walker (2 wheels)  Recommendations for Other Services       Functional Status Assessment Patient has had a recent decline in their functional status and demonstrates the ability to make significant improvements in function in a reasonable and predictable amount of time.     Precautions / Restrictions Precautions Precautions: Fall;Knee Precaution Booklet Issued: Yes (comment) Recall of Precautions/Restrictions: Intact Restrictions Weight Bearing Restrictions Per Provider Order: Yes RLE Weight Bearing Per Provider Order: Weight bearing as tolerated      Mobility  Bed Mobility Overal bed mobility: Needs Assistance Bed Mobility: Supine to Sit     Supine to sit: Min assist     General bed mobility comments: needs assist for B LE management    Transfers Overall transfer level: Needs assistance Equipment used: Rolling walker (2  wheels) Transfers: Sit to/from Stand Sit to Stand: Min assist           General transfer comment: needs assist for hand placement in addition to rocking momentum prior to standing    Ambulation/Gait Ambulation/Gait assistance: Contact guard assist Gait Distance (Feet): 80 Feet Assistive device: Rolling walker (2 wheels) Gait Pattern/deviations: Step-to pattern       General Gait Details: ambulated in hallway with step to gait pattern. Cues for looking forward and RW sequencing  Stairs            Wheelchair Mobility     Tilt Bed    Modified Rankin (Stroke Patients Only)       Balance Overall balance assessment: Needs assistance Sitting-balance support: Feet supported Sitting balance-Leahy Scale: Good     Standing balance support: Bilateral upper extremity supported Standing balance-Leahy Scale: Fair                               Pertinent Vitals/Pain Pain Assessment Pain Assessment: Faces Faces Pain Scale: Hurts a little bit Pain Location: R knee Pain Descriptors / Indicators: Operative site guarding Pain Intervention(s): Limited activity within patient's tolerance, Premedicated before session, Repositioned, Ice applied    Home Living Family/patient expects to be discharged to:: Private residence Living Arrangements: Spouse/significant other Available Help at Discharge: Family Type of Home: House Home Access: Stairs to enter Entrance Stairs-Rails: Left Entrance Stairs-Number of Steps: 3   Home Layout: Able to live on main level with bedroom/bathroom Home Equipment: Toilet riser;Cane - single point      Prior Function Prior Level of Function :  Independent/Modified Independent             Mobility Comments: was using SPC ADLs Comments: indep     Extremity/Trunk Assessment   Upper Extremity Assessment Upper Extremity Assessment: Overall WFL for tasks assessed    Lower Extremity Assessment Lower Extremity Assessment:  Generalized weakness (R LE grossly 3/5)       Communication   Communication Communication: No apparent difficulties    Cognition Arousal: Alert Behavior During Therapy: WFL for tasks assessed/performed   PT - Cognitive impairments: No apparent impairments                       PT - Cognition Comments: motivated to participate Following commands: Intact       Cueing Cueing Techniques: Verbal cues     General Comments      Exercises Total Joint Exercises Goniometric ROM: R knee AAROM: 0-60 degrees Other Exercises Other Exercises: pt ambulated to bathroom with cga. Able to perform self hygiene with supervision and then don mesh panties and pad with min assist   Assessment/Plan    PT Assessment Patient needs continued PT services  PT Problem List Decreased strength;Decreased range of motion;Decreased balance;Decreased mobility;Decreased knowledge of use of DME;Pain       PT Treatment Interventions DME instruction;Gait training;Stair training;Therapeutic exercise;Balance training    PT Goals (Current goals can be found in the Care Plan section)  Acute Rehab PT Goals Patient Stated Goal: to go home PT Goal Formulation: With patient Time For Goal Achievement: 10/03/23 Potential to Achieve Goals: Good    Frequency BID     Co-evaluation               AM-PAC PT "6 Clicks" Mobility  Outcome Measure Help needed turning from your back to your side while in a flat bed without using bedrails?: A Little Help needed moving from lying on your back to sitting on the side of a flat bed without using bedrails?: A Little Help needed moving to and from a bed to a chair (including a wheelchair)?: A Little Help needed standing up from a chair using your arms (e.g., wheelchair or bedside chair)?: A Little Help needed to walk in hospital room?: A Little Help needed climbing 3-5 steps with a railing? : A Little 6 Click Score: 18    End of Session Equipment Utilized  During Treatment: Gait belt Activity Tolerance: Patient tolerated treatment well Patient left: in chair;with family/visitor present Nurse Communication: Mobility status PT Visit Diagnosis: Muscle weakness (generalized) (M62.81);Difficulty in walking, not elsewhere classified (R26.2);Pain;Unsteadiness on feet (R26.81) Pain - Right/Left: Right Pain - part of body: Knee    Time: 1610-9604 PT Time Calculation (min) (ACUTE ONLY): 34 min   Charges:   PT Evaluation $PT Eval Low Complexity: 1 Low PT Treatments $Gait Training: 8-22 mins $Therapeutic Activity: 8-22 mins PT General Charges $$ ACUTE PT VISIT: 1 Visit         Amparo Balk, PT, DPT, GCS [phone]   Darnell Stimson 09/19/2023, 4:52 PM

## 2023-09-19 NOTE — Anesthesia Procedure Notes (Signed)
 Spinal  Start time: 09/19/2023 7:20 AM End time: 09/19/2023 7:23 AM Staffing Performed: resident/CRNA  Resident/CRNA: Wilkins Hardy I, CRNA Performed by: Wilkins Hardy I, CRNA Authorized by: Baltazar Bonier, MD   Preanesthetic Checklist Completed: patient identified, IV checked, site marked, risks and benefits discussed, surgical consent, monitors and equipment checked, pre-op evaluation and timeout performed Spinal Block Patient position: sitting Prep: ChloraPrep Patient monitoring: heart rate, continuous pulse ox and blood pressure Approach: midline Location: L4-5 Injection technique: single-shot Needle Needle type: Pencil-Tip  Needle gauge: 24 G Needle length: 9 cm Needle insertion depth: 8 cm Assessment Events: CSF return

## 2023-09-19 NOTE — Progress Notes (Signed)
 Patient is not able to walk the distance required to go the bathroom, or he/she is unable to safely negotiate stairs required to access the bathroom.  A 3in1 BSC will alleviate this problem   Amenda Duclos P. [name] M.D.

## 2023-09-19 NOTE — TOC Progression Note (Signed)
 Transition of Care [facility]) - Progression Note    Patient Details  Name: Veronica Cisneros MRN: [id number] Date of Birth: 06/27/1950  [facility] CM/SW Contact  Alexandra Ice, RN Phone Number: 09/19/2023, 1:25 PM  Clinical Narrative:    Patient set up with [facility] by surgeon's office prior to surgery. Agency information added to AVS.         Expected Discharge Plan and Services                                               Social Determinants of Health (SDOH) Interventions SDOH Screenings   Food Insecurity: No Food Insecurity (09/19/2023)  Housing: Low Risk  (09/19/2023)  Transportation Needs: No Transportation Needs (09/19/2023)  Utilities: Not At Risk (09/19/2023)  Financial Resource Strain: Low Risk  (06/02/2023)   Received from [facility]  Social Connections: Moderately Isolated (09/19/2023)  Tobacco Use: Low Risk  (09/19/2023)    Readmission Risk Interventions     No data to display

## 2023-09-19 NOTE — Interval H&P Note (Signed)
 History and Physical Interval Note:  09/19/2023 7:01 AM  Veronica Cisneros  has presented today for surgery, with the diagnosis of PRIMARY OSTEOARTHRITIS OF RIGHT KNEE..  The various methods of treatment have been discussed with the patient and family. After consideration of risks, benefits and other options for treatment, the patient has consented to  Procedure(s): ARTHROPLASTY, KNEE, TOTAL, USING IMAGELESS COMPUTER-ASSISTED NAVIGATION (Right) as a surgical intervention.  The patient's history has been reviewed, patient examined, no change in status, stable for surgery.  I have reviewed the patient's chart and labs.  Questions were answered to the patient's satisfaction.     Kataleena Holsapple P Deyonte Cadden

## 2023-09-19 NOTE — Transfer of Care (Signed)
 Immediate Anesthesia Transfer of Care Note  Patient: Veronica Cisneros  Procedure(s) Performed: ARTHROPLASTY, KNEE, TOTAL, USING IMAGELESS COMPUTER-ASSISTED NAVIGATION (Right: Knee)  Patient Location: PACU  Anesthesia Type:Spinal  Level of Consciousness: drowsy  Airway & Oxygen Therapy: Patient Spontanous Breathing and Patient connected to face mask oxygen  Post-op Assessment: Report given to RN  Post vital signs: stable  Last Vitals:  Vitals Value Taken Time  BP 140/69 09/19/23 1111  Temp    Pulse 79 09/19/23 1113  Resp 25 09/19/23 1113  SpO2 97 % 09/19/23 1113  Vitals shown include unfiled device data.  Last Pain:  Vitals:   09/19/23 0614  TempSrc: Temporal  PainSc: 8          Complications: No notable events documented.

## 2023-09-19 NOTE — Anesthesia Preprocedure Evaluation (Signed)
 Anesthesia Evaluation  Patient identified by MRN, date of birth, ID band Patient awake    Reviewed: Allergy & Precautions, H&P , NPO status , Patient's Chart, lab work & pertinent test results  Airway Mallampati: III  TM Distance: >3 FB Neck ROM: full    Dental  (+) Chipped   Pulmonary neg pulmonary ROS   Pulmonary exam normal        Cardiovascular hypertension, Normal cardiovascular exam  LHC per patient two years ago with no siginifant findings   Neuro/Psych Periodic epidural injections  Neuromuscular disease (chronic groin pain, severe stenosis.)  negative psych ROS   GI/Hepatic Neg liver ROS,GERD  ,,  Endo/Other  negative endocrine ROS    Renal/GU      Musculoskeletal  (+) Arthritis ,    Abdominal  (+) + obese  Peds  Hematology negative hematology ROS (+)   Anesthesia Other Findings Past Medical History: No date: 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) No date: Basal cell carcinoma No date: Blood transfusion without reported diagnosis     Comment:  after colonoscopy 2018 No date: breast cancer     Comment:  right No date: Bursitis No date: Colon polyps No date: Complication of anesthesia     Comment:  shaking No date: DDD (degenerative disc disease), lumbar No date: Diverticulosis No date: Follicular lymphoma (HCC) No date: GERD (gastroesophageal reflux disease) No date: Heart murmur No date: High cholesterol No date: Hypertension No date: IBS (irritable bowel syndrome) No date: Osteopenia No date: Spinal stenosis  Past Surgical History: No date: ABDOMINAL HYSTERECTOMY No date: BACK SURGERY No date: BREAST SURGERY; Right No date: CARDIAC CATHETERIZATION No date: CHOLECYSTECTOMY No date: colonscopy No date: FIBULA FRACTURE SURGERY; Right No date: FL INJ RIGHT KNEE MR ARTHROGRAM ([facility] HX) No date: KNEE ARTHROSCOPY; Right No date: LYMPH GLAND EXCISION     Comment:  23 nodes removed from  right axilla No date: SPINE SURGERY     Comment:  L 3/4 No date: TUBAL LIGATION  BMI    Body Mass Index: 32.06 kg/m      Reproductive/Obstetrics negative OB ROS                              Anesthesia Physical Anesthesia Plan  ASA: 2  Anesthesia Plan: Spinal   Post-op Pain Management: Regional block* and Ofirmev  IV (intra-op)*   Induction: Intravenous  PONV Risk Score and Plan: Propofol  infusion  Airway Management Planned: Natural Airway  Additional Equipment:   Intra-op Plan:   Post-operative Plan:   Informed Consent: I have reviewed the patients History and Physical, chart, labs and discussed the procedure including the risks, benefits and alternatives for the proposed anesthesia with the patient or authorized representative who has indicated his/her understanding and acceptance.     Dental Advisory Given  Plan Discussed with: CRNA and Surgeon  Anesthesia Plan Comments:          Anesthesia Quick Evaluation

## 2023-09-19 NOTE — Anesthesia Postprocedure Evaluation (Signed)
 Anesthesia Post Note  Patient: TONILYNN BIEKER  Procedure(s) Performed: ARTHROPLASTY, KNEE, TOTAL, USING IMAGELESS COMPUTER-ASSISTED NAVIGATION (Right: Knee)  Patient location during evaluation: PACU Anesthesia Type: Spinal Level of consciousness: awake and alert Pain management: pain level controlled Vital Signs Assessment: post-procedure vital signs reviewed and stable Respiratory status: spontaneous breathing, nonlabored ventilation and respiratory function stable Cardiovascular status: blood pressure returned to baseline and stable Postop Assessment: no apparent nausea or vomiting Anesthetic complications: no   No notable events documented.   Last Vitals:  Vitals:   09/19/23 1239 09/19/23 1245  BP: (!) 162/81 (!) 167/72  Pulse: 71 69  Resp: 15 16  Temp:  36.6 C  SpO2: 98% 97%    Last Pain:  Vitals:   09/19/23 1245  TempSrc:   PainSc: 5                  Baltazar Bonier

## 2023-09-19 NOTE — Plan of Care (Signed)
 ?  Problem: Coping: ?Goal: Level of anxiety will decrease ?Outcome: Progressing ?  ?Problem: Safety: ?Goal: Ability to remain free from injury will improve ?Outcome: Progressing ?  ?

## 2023-09-20 ENCOUNTER — Encounter: Payer: Self-pay | Admitting: Orthopedic Surgery

## 2023-09-20 DIAGNOSIS — M1711 Unilateral primary osteoarthritis, right knee: Secondary | ICD-10-CM | POA: Diagnosis not present

## 2023-09-20 MED ORDER — GABAPENTIN 300 MG PO CAPS
ORAL_CAPSULE | ORAL | Status: AC
  Filled 2023-09-20: qty 1

## 2023-09-20 MED ORDER — CELECOXIB 200 MG PO CAPS: 200.0000 mg | ORAL_CAPSULE | Freq: Two times a day (BID) | ORAL | 1 refills | Status: DC

## 2023-09-20 MED ORDER — OXYCODONE HCL 5 MG PO TABS: 5.0000 mg | ORAL_TABLET | ORAL | 0 refills | Status: AC | PRN

## 2023-09-20 MED ORDER — ASPIRIN 81 MG PO CHEW: 81.0000 mg | CHEWABLE_TABLET | Freq: Two times a day (BID) | ORAL | Status: AC

## 2023-09-20 MED ORDER — TRAMADOL HCL 50 MG PO TABS
50.0000 mg | ORAL_TABLET | ORAL | 0 refills | Status: AC | PRN
Start: 2023-09-20 — End: ?

## 2023-09-20 MED ORDER — ACETAMINOPHEN 10 MG/ML IV SOLN
INTRAVENOUS | Status: AC
  Filled 2023-09-20: qty 100

## 2023-09-20 MED ORDER — EZETIMIBE 10 MG PO TABS
ORAL_TABLET | ORAL | Status: AC
  Filled 2023-09-20: qty 1

## 2023-09-20 NOTE — Progress Notes (Signed)
 DISCHARGE NOTE:  Pt given discharge instructions, scripts and 2 honeycomb dressings. TED hose on both legs. BSC and walker sent with pt. Pt wheeled to car by staff, husband providing transportation home.

## 2023-09-20 NOTE — Plan of Care (Signed)
  Problem: Activity: Goal: Risk for activity intolerance will decrease Outcome: Progressing   Problem: Elimination: Goal: Will not experience complications related to urinary retention Outcome: Progressing   Problem: Pain Managment: Goal: General experience of comfort will improve and/or be controlled Outcome: Progressing   Problem: Activity: Goal: Ability to avoid complications of mobility impairment will improve Outcome: Progressing

## 2023-09-20 NOTE — Plan of Care (Signed)
  Problem: Activity: Goal: Risk for activity intolerance will decrease Outcome: Progressing   Problem: Elimination: Goal: Will not experience complications related to urinary retention Outcome: Progressing   Problem: Pain Managment: Goal: General experience of comfort will improve and/or be controlled Outcome: Progressing

## 2023-09-20 NOTE — TOC Transition Note (Signed)
 Transition of Care [facility]) - Discharge Note   Patient Details  Name: Veronica Cisneros MRN: [id number] Date of Birth: 1950-07-24  Transition of Care [facility]) CM/SW Contact:  Alexandra Ice, RN Phone Number: 09/20/2023, 9:37 AM   Clinical Narrative:     Patient to discharge today, home with home health. BSC and RW delivered by [employer] to bedside. [facility] setup by surgeon's office prior to surgery.   Barriers to Discharge: Barriers Resolved   Patient Goals and CMS Choice Patient states their goals for this hospitalization and ongoing recovery are:: get better CMS Medicare.gov Compare Post Acute Care list provided to:: Patient Choice offered to / list presented to : Patient  Expected Discharge Plan and Services           Expected Discharge Date: 09/20/23               DME Arranged: Bedside commode, Walker rolling DME Agency: [facility] Date DME Agency Contacted: 09/20/23 Time DME Agency Contacted: [phone] Representative spoke with at DME Agency: Sam Creighton HH Arranged: PT, OT HH Agency: [facility] Date [facility] Agency Contacted: 09/20/23 Time HH Agency Contacted: [phone] Representative spoke with at [facility] Agency: Loetta Ringer  Prior Living Arrangements/Services                       Activities of Daily Living   ADL Screening (condition at time of admission) Independently performs ADLs?: Yes (appropriate for developmental age) Is the patient deaf or have difficulty hearing?: No Does the patient have difficulty seeing, even when wearing glasses/contacts?: No Does the patient have difficulty concentrating, remembering, or making decisions?: No  Permission Sought/Granted                  Emotional Assessment              Admission diagnosis:  Primary osteoarthritis of right knee [M17.11] History of total knee arthroplasty, right [Z96.651] Patient Active Problem List   Diagnosis Date Noted   History of total knee arthroplasty, right 09/19/2023   Primary  osteoarthritis of right knee 09/18/2023   History of radiation therapy 11/25/2021   LVH (left ventricular hypertrophy) 05/20/2021   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) 05/29/2018   Anemia 07/15/2016   DDD (degenerative disc disease) 07/15/2016   Diverticulosis 07/15/2016   DJD (degenerative joint disease) 07/15/2016   IBS (irritable bowel syndrome) 07/15/2016   Thyroid nodule 07/15/2016   Cellulitis of right breast    Cellulitis 04/11/2016   Pain in limb 02/18/2016   Swelling of limb 02/18/2016   Essential hypertension 02/18/2016   Hyperlipidemia 02/18/2016   B12 deficiency 11/14/2015   Facet arthritis of lumbar region 11/14/2015   Ulceration of colon determined by endoscopy 11/14/2015   Trochanteric bursitis of left hip 05/09/2014   Lumbar radiculitis 04/04/2014   Lumbar stenosis with neurogenic claudication 04/04/2014   Vitamin D deficiency 11/21/2013   Nocturia 06/12/2012   Increased frequency of urination 06/12/2012   History of breast cancer 12/23/2011   PCP:  Melchor Spoon, MD Pharmacy:   [facility] [phone] [place], [place] - [address] [place] [address] Phone: [phone] Fax: [phone]     Social Determinants of Health (SDOH) Interventions    Readmission Risk Interventions     No data to display           Final next level of care: Home w Home Health Services Barriers to Discharge: Barriers  Resolved   Patient Goals and CMS Choice Patient states their goals for this hospitalization and ongoing recovery are:: get better CMS Medicare.gov Compare Post Acute Care list provided to:: Patient Choice offered to / list presented to : Patient      Discharge Placement                Patient to be transferred to facility by: family Name of family member notified: Alphonsa Jasper Patient and family notified of of transfer: 09/20/23  Discharge Plan and Services Additional resources added to  the After Visit Summary for                  DME Arranged: Bedside commode, Walker rolling DME Agency: [facility] Date DME Agency Contacted: 09/20/23 Time DME Agency Contacted: [phone] Representative spoke with at DME Agency: Sam Creighton HH Arranged: PT, OT [facility] Agency: [facility] Date [facility] Agency Contacted: 09/20/23 Time HH Agency Contacted: [phone] Representative spoke with at [facility] Agency: Loetta Ringer  Social Drivers of Health (SDOH) Interventions SDOH Screenings   Food Insecurity: No Food Insecurity (09/19/2023)  Housing: Low Risk  (09/19/2023)  Transportation Needs: No Transportation Needs (09/19/2023)  Utilities: Not At Risk (09/19/2023)  Financial Resource Strain: Low Risk  (06/02/2023)   Received from [facility]  Social Connections: Moderately Isolated (09/19/2023)  Tobacco Use: Low Risk  (09/19/2023)     Readmission Risk Interventions     No data to display

## 2023-09-20 NOTE — Evaluation (Signed)
 Occupational Therapy Evaluation Patient Details Name: Veronica Cisneros MRN: [id number] DOB: January 04, 1951 Today's Date: 09/20/2023   History of Present Illness   Pt admitted for R TKR and is POD 1 at time of evaluation.     Clinical Impressions Pt was seen for OT evaluation this date. PTA, pt lives with her husband in a 2 level home with 4 steps to get into the home. Reports able to live on main level with access to bedroom/bathroom. Pt was IND at baseline, used a walking stick recently to ambulate d/t knee pain, however no AD prior to that. Pt was very active and IND. Pt currently requires Min assist for LB dressing while in seated position due to pain and limited AROM of R knee. Pt instructed in polar care mgt, falls prevention strategies, home/routines modifications, DME/AE for LB bathing and dressing tasks, and compression stocking mgt. She performed all bed mobility, STS and mobility in the room at supervision level this date. Her husband was present for all training and is able to assist her on DC home. Do not currently anticipate any OT needs following this hospitalization.        If plan is discharge home, recommend the following:   A little help with walking and/or transfers;A little help with bathing/dressing/bathroom;Help with stairs or ramp for entrance     Functional Status Assessment   Patient has had a recent decline in their functional status and demonstrates the ability to make significant improvements in function in a reasonable and predictable amount of time.     Equipment Recommendations   None recommended by OT     Recommendations for Other Services         Precautions/Restrictions   Precautions Precautions: Fall;Knee Precaution Booklet Issued: Yes (comment) Recall of Precautions/Restrictions: Intact Restrictions Weight Bearing Restrictions Per Provider Order: Yes RLE Weight Bearing Per Provider Order: Weight bearing as tolerated     Mobility Bed  Mobility Overal bed mobility: Modified Independent                  Transfers Overall transfer level: Needs assistance Equipment used: Rolling walker (2 wheels) Transfers: Sit to/from Stand Sit to Stand: Supervision           General transfer comment: supervision level for STS and mobility within the room using RW      Balance Overall balance assessment: Needs assistance Sitting-balance support: Feet supported Sitting balance-Leahy Scale: Good     Standing balance support: Bilateral upper extremity supported Standing balance-Leahy Scale: Fair Standing balance comment: RW use                           ADL either performed or assessed with clinical judgement   ADL Overall ADL's : Needs assistance/impaired                 Upper Body Dressing : Set up;Sitting Upper Body Dressing Details (indicate cue type and reason): at EOB Lower Body Dressing: Minimal assistance Lower Body Dressing Details (indicate cue type and reason): to don socks, able to don pants on her own             Functional mobility during ADLs: Supervision/safety;Rolling walker (2 wheels)       Vision         Perception         Praxis         Pertinent Vitals/Pain Pain Assessment Pain Assessment: 0-10 Faces Pain Scale:  Hurts a little bit Pain Location: R knee Pain Descriptors / Indicators: Operative site guarding Pain Intervention(s): Monitored during session     Extremity/Trunk Assessment Upper Extremity Assessment Upper Extremity Assessment: Overall WFL for tasks assessed   Lower Extremity Assessment Lower Extremity Assessment: Generalized weakness       Communication Communication Communication: No apparent difficulties   Cognition Arousal: Alert Behavior During Therapy: WFL for tasks assessed/performed                                 Following commands: Intact       Cueing  General Comments   Cueing Techniques: Verbal  cues      Exercises Other Exercises Other Exercises: Edu on role of OT, use of AE/AD/DME, home modifications, etc.   Shoulder Instructions      Home Living Family/patient expects to be discharged to:: Private residence Living Arrangements: Spouse/significant other Available Help at Discharge: Family Type of Home: House Home Access: Stairs to enter Secretary/administrator of Steps: 3 Entrance Stairs-Rails: Left Home Layout: Able to live on main level with bedroom/bathroom     Bathroom Shower/Tub: Producer, television/film/video: Standard     Home Equipment: Toilet riser;Cane - single point;Grab bars - tub/shower;Grab bars - toilet          Prior Functioning/Environment Prior Level of Function : Independent/Modified Independent             Mobility Comments: was using SPC ADLs Comments: indep    OT Problem List:     OT Treatment/Interventions:        OT Goals(Current goals can be found in the care plan section)       OT Frequency:       Co-evaluation              AM-PAC OT "6 Clicks" Daily Activity     Outcome Measure Help from another person eating meals?: None Help from another person taking care of personal grooming?: None Help from another person toileting, which includes using toliet, bedpan, or urinal?: A Little Help from another person bathing (including washing, rinsing, drying)?: A Little Help from another person to put on and taking off regular upper body clothing?: None Help from another person to put on and taking off regular lower body clothing?: A Little 6 Click Score: 21   End of Session Equipment Utilized During Treatment: Rolling walker (2 wheels) Nurse Communication: Mobility status  Activity Tolerance: Patient tolerated treatment well Patient left: in chair;with family/visitor present;with call bell/phone within reach  OT Visit Diagnosis: Other abnormalities of gait and mobility (R26.89)                Time:  1610-9604 OT Time Calculation (min): 29 min Charges:  OT General Charges $OT Visit: 1 Visit OT Evaluation $OT Eval Moderate Complexity: 1 Mod OT Treatments $Self Care/Home Management : 8-22 mins  Decarlo Rivet, OTR/L 09/20/23, 12:54 PM  Veronica Cisneros 09/20/2023, 12:51 PM

## 2023-09-20 NOTE — Progress Notes (Signed)
 Subjective: 1 Day Post-Op Procedure(s) (LRB): ARTHROPLASTY, KNEE, TOTAL, USING IMAGELESS COMPUTER-ASSISTED NAVIGATION (Right) Patient reports pain as mild.   Patient seen in rounds with Dr. Aubry Blase. Patient is well, and has had no acute complaints or problems Denies any CP, SOB, N/V, fevers or chills We will start therapy today.  Plan is to go Home after hospital stay.  Objective: Vital signs in last 24 hours: Temp:  [97.2 F (36.2 C)-97.9 F (36.6 C)] 97.7 F (36.5 C) (05/20 0401) Pulse Rate:  [69-84] 80 (05/20 0401) Resp:  [15-21] 16 (05/20 0401) BP: (117-167)/(56-86) 117/56 (05/20 0401) SpO2:  [94 %-99 %] 94 % (05/20 0401)  Intake/Output from previous day:  Intake/Output Summary (Last 24 hours) at 09/20/2023 0803 Last data filed at 09/20/2023 0402 Gross per 24 hour  Intake 1338.25 ml  Output 1650 ml  Net -311.75 ml    Intake/Output this shift: No intake/output data recorded.  Labs: No results for input(s): "HGB" in the last 72 hours. No results for input(s): "WBC", "RBC", "HCT", "PLT" in the last 72 hours. No results for input(s): "NA", "K", "CL", "CO2", "BUN", "CREATININE", "GLUCOSE", "CALCIUM " in the last 72 hours. No results for input(s): "LABPT", "INR" in the last 72 hours.  EXAM General - Patient is Alert, Appropriate, and Oriented Extremity - Neurologically intact Neurovascular intact Sensation intact distally Intact pulses distally Dorsiflexion/Plantar flexion intact No cellulitis present Compartment soft Dressing - dressing C/D/I and no drainage Motor Function - intact, moving foot and toes well on exam.  JP Drain pulled without difficulty. 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)    Basal cell carcinoma    Blood transfusion without reported diagnosis    after colonoscopy 2018   breast cancer    right   Bursitis    Colon polyps    Complication of anesthesia    shaking   DDD (degenerative disc  disease), lumbar    Diverticulosis    Follicular lymphoma (HCC)    GERD (gastroesophageal reflux disease)    Heart murmur    High cholesterol    Hypertension    IBS (irritable bowel syndrome)    Osteopenia    Spinal stenosis     Assessment/Plan: 1 Day Post-Op Procedure(s) (LRB): ARTHROPLASTY, KNEE, TOTAL, USING IMAGELESS COMPUTER-ASSISTED NAVIGATION (Right) Principal Problem:   History of total knee arthroplasty, right  Estimated body mass index is 32.06 kg/m as calculated from the following:   Height as of this encounter: 5\' 3"  (1.6 m).   Weight as of this encounter: 82.1 kg. Advance diet Up with therapy  Patient will continue to work with physical therapy to pass postoperative PT protocols, ROM and strengthening  Discussed with the patient continuing to utilize Polar Care  Patient will use bone foam in 20-30 minute intervals  Patient will wear TED hose bilaterally to help prevent DVT and clot formation  Discussed the Aquacel bandage.  This bandage will stay in place 7 days postoperatively.  Can be replaced with honeycomb bandages that will be sent home with the patient  Discussed sending the patient home with tramadol  and oxycodone  for as needed pain management.  Patient will also continue at home meloxicam to help with swelling and inflammation.  Patient will take an 81 mg aspirin  twice daily for DVT prophylaxis  JP drain removed without difficulty, intact  Weight-Bearing as tolerated to right leg  Patient will follow-up with [facility] clinic orthopedics in 2 weeks for staple removal and  reevaluation  Wadie Guile, PA-C [facility] 09/20/2023, 8:03 AM

## 2023-09-20 NOTE — Discharge Summary (Signed)
 Physician Discharge Summary  Subjective: 1 Day Post-Op Procedure(s) (LRB): ARTHROPLASTY, KNEE, TOTAL, USING IMAGELESS COMPUTER-ASSISTED NAVIGATION (Right) Patient reports pain as mild.   Patient seen in rounds with Dr. Aubry Blase. Patient is well, and has had no acute complaints or problems Denies any CP, SOB, N/V, fevers or chills We will start therapy today.  Patient is ready to go home  Physician Discharge Summary  Patient ID: Veronica Cisneros MRN: [id number] DOB/AGE: January 29, 1951 73 y.o.  Admit date: 09/19/2023 Discharge date: 09/20/2023  Admission Diagnoses:  Discharge Diagnoses:  Principal Problem:   History of total knee arthroplasty, right   Discharged Condition: good  Hospital Course: Patient presented to the hospital on 09/19/2023 for an elective right total knee arthroplasty performed by Dr. Aubry Blase. Patient was given 1g of TXA and 2g of Ancef  prior to the procedure. she tolerated the procedure well without any complications. See procedural note below for details. Postoperatively, the patient did very well. she was able to pass PT protocols on post-op day one without any issues. JP drain was removed without any difficulty and was intact. she was able to void her bladder without any difficulty. Physical exam was unremarkable. she denies any SOB, CP, N/V, fevers or chills. Vital signs are stable. Patient is stable to discharge home.  PROCEDURE:  Right total knee arthroplasty using computer-assisted navigation   SURGEON:  Maxene Span. M.D.   ASSISTANT:  Benjiman Bras, PA-C (present and scrubbed throughout the case, critical for assistance with exposure, retraction, instrumentation, and closure)   ANESTHESIA: spinal   ESTIMATED BLOOD LOSS: 50 mL   FLUIDS REPLACED: 800 mL of crystalloid   TOURNIQUET TIME: 92 minutes   DRAINS: 2 medium Hemovac drains   SOFT TISSUE RELEASES: Anterior cruciate ligament, posterior cruciate ligament, deep medial collateral ligament,  patellofemoral ligament   IMPLANTS UTILIZED: DePuy Attune size 5 posterior stabilized femoral component (cemented), size 5 rotating platform tibial component (cemented), 35 mm medialized dome patella (cemented), and a 10 mm stabilized rotating platform polyethylene insert.  Treatments: None  Discharge Exam: Blood pressure (!) 146/53, pulse 78, temperature 98.2 F (36.8 C), temperature source Oral, resp. rate 15, height 5\' 3"  (1.6 m), weight 82.1 kg, SpO2 98%.   Disposition: Home   Allergies as of 09/20/2023       Reactions   Other    General anesthesia - causes shaking    Cephalexin Nausea Only   Ciprofloxacin Diarrhea, Nausea Only   Valium [diazepam] Other (See Comments)   Hyperactivity         Medication List     TAKE these medications    acetaminophen  650 MG CR tablet Commonly known as: TYLENOL  Take 1,300 mg by mouth 2 (two) times daily.   ascorbic acid  100 MG tablet Commonly known as: VITAMIN C  Take 100 mg by mouth at bedtime.   aspirin  81 MG chewable tablet Chew 1 tablet (81 mg total) by mouth 2 (two) times daily.   carvedilol  6.25 MG tablet Commonly known as: COREG  Take 6.25 mg by mouth 2 (two) times daily.   clobetasol 0.05 % external solution Commonly known as: TEMOVATE Apply 1 Application topically 2 (two) times daily as needed (psoriasis).   clotrimazole 1 % cream Commonly known as: LOTRIMIN Apply 1 Application topically 2 (two) times daily as needed (heat rash).   cyanocobalamin 1000 MCG tablet Commonly known as: VITAMIN B12 Take 1,000 mcg by mouth at bedtime.   ezetimibe  10 MG tablet Commonly known as: ZETIA  Take 10 mg by  mouth daily.   folic acid  1 MG tablet Commonly known as: FOLVITE  Take 2 mg by mouth daily.   furosemide  20 MG tablet Commonly known as: LASIX  Take 20 mg by mouth every other day as needed for edema (QOD).   gabapentin  300 MG capsule Commonly known as: NEURONTIN  Take 300 mg by mouth 3 (three) times daily.    loperamide  2 MG capsule Commonly known as: IMODIUM  Take 2 mg by mouth as needed for diarrhea or loose stools.   meloxicam 15 MG tablet Commonly known as: MOBIC Take 15 mg by mouth daily as needed for pain.   nystatin powder Apply 1 Application topically daily as needed (rash).   nystatin cream Commonly known as: MYCOSTATIN Apply 1 Application topically daily as needed for dry skin.   oxyCODONE  5 MG immediate release tablet Commonly known as: Oxy IR/ROXICODONE  Take 1 tablet (5 mg total) by mouth every 4 (four) hours as needed for moderate pain (pain score 4-6) (pain score 4-6).   pantoprazole  40 MG tablet Commonly known as: PROTONIX  Take 40 mg by mouth daily.   potassium chloride  10 MEQ tablet Commonly known as: KLOR-CON  Take 10 mEq by mouth every other day as needed (with lasix ).   prednisoLONE  5 MG Tabs tablet Take 5-25 mg by mouth See admin instructions. Take 25 mg on day 1, then reduce by one tablet daily until done, take as needed for mastitis flare   rosuvastatin  20 MG tablet Commonly known as: CRESTOR  Take 10 mg by mouth at bedtime.   traMADol  50 MG tablet Commonly known as: ULTRAM  Take 1-2 tablets (50-100 mg total) by mouth every 4 (four) hours as needed for moderate pain (pain score 4-6).   valsartan 40 MG tablet Commonly known as: DIOVAN Take 40 mg by mouth 2 (two) times daily.   Vitamin D3 25 MCG (1000 UT) Caps Take 2,000 Units by mouth at bedtime.               Durable Medical Equipment  (From admission, onward)           Start     Ordered   09/19/23 1137  DME Walker rolling  Once       Question:  Patient needs a walker to treat with the following condition  Answer:  Total knee replacement status   09/19/23 1136   09/19/23 1137  DME Bedside commode  Once       Comments: Patient is not able to walk the distance required to go the bathroom, or he/she is unable to safely negotiate stairs required to access the bathroom.  A 3in1 BSC will  alleviate this problem  Question:  Patient needs a bedside commode to treat with the following condition  Answer:  Total knee replacement status   09/19/23 1136            Follow-up Information     Wadie Guile, PA-C Follow up on 10/04/2023.   Specialty: Orthopedic Surgery Why: at 9:15AM Contact information: [address] [place] [address] [phone]         Arlyne Lame, MD Follow up on 11/01/2023.   Specialty: Orthopedic Surgery Why: at 9:00AM Contact information: [address] [facility] [place] [address] [phone]                 Signed: Benjiman Bras 09/20/2023, 8:38 AM   Objective: Vital signs in last 24 hours: Temp:  [97.2 F (36.2 C)-98.2 F (36.8 C)] 98.2 F (  36.8 C) (05/20 0827) Pulse Rate:  [69-84] 78 (05/20 0827) Resp:  [15-21] 15 (05/20 0827) BP: (117-167)/(53-86) 146/53 (05/20 0827) SpO2:  [94 %-99 %] 98 % (05/20 0827)  Intake/Output from previous day:  Intake/Output Summary (Last 24 hours) at 09/20/2023 0838 Last data filed at 09/20/2023 0402 Gross per 24 hour  Intake 1338.25 ml  Output 1550 ml  Net -211.75 ml    Intake/Output this shift: No intake/output data recorded.  Labs: No results for input(s): "HGB" in the last 72 hours. No results for input(s): "WBC", "RBC", "HCT", "PLT" in the last 72 hours. No results for input(s): "NA", "K", "CL", "CO2", "BUN", "CREATININE", "GLUCOSE", "CALCIUM " in the last 72 hours. No results for input(s): "LABPT", "INR" in the last 72 hours.  EXAM: General - Patient is Alert, Appropriate, and Oriented Extremity - Neurologically intact Neurovascular intact Sensation intact distally Intact pulses distally Dorsiflexion/Plantar flexion intact No cellulitis present Compartment soft Dressing - dressing C/D/I and no drainage Motor Function - intact, moving foot and toes well on exam.  JP Drain pulled without difficulty. Intact Assessment/Plan: 1 Day  Post-Op Procedure(s) (LRB): ARTHROPLASTY, KNEE, TOTAL, USING IMAGELESS COMPUTER-ASSISTED NAVIGATION (Right) Procedure(s) (LRB): ARTHROPLASTY, KNEE, TOTAL, USING IMAGELESS COMPUTER-ASSISTED NAVIGATION (Right) Past Medical History:  Diagnosis Date   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)    Basal cell carcinoma    Blood transfusion without reported diagnosis    after colonoscopy 2018   breast cancer    right   Bursitis    Colon polyps    Complication of anesthesia    shaking   DDD (degenerative disc disease), lumbar    Diverticulosis    Follicular lymphoma (HCC)    GERD (gastroesophageal reflux disease)    Heart murmur    High cholesterol    Hypertension    IBS (irritable bowel syndrome)    Osteopenia    Spinal stenosis    Principal Problem:   History of total knee arthroplasty, right  Estimated body mass index is 32.06 kg/m as calculated from the following:   Height as of this encounter: 5\' 3"  (1.6 m).   Weight as of this encounter: 82.1 kg.  Patient will continue to work with physical therapy    Discussed with the patient continuing to utilize Polar Care   Patient will use bone foam in 20-30 minute intervals   Patient will wear TED hose bilaterally to help prevent DVT and clot formation   Discussed the Aquacel bandage.  This bandage will stay in place 7 days postoperatively.  Can be replaced with honeycomb bandages that will be sent home with the patient   Discussed sending the patient home with tramadol  and oxycodone  for as needed pain management.  Patient will also continue at home meloxicam to help with swelling and inflammation.  Patient will take an 81 mg aspirin  twice daily for DVT prophylaxis   JP drain removed without difficulty, intact   Weight-Bearing as tolerated to right leg   Patient will follow-up with [facility] orthopedics in 2 weeks for staple removal and reevaluation  Diet - Regular diet Follow up - in 2 weeks Activity -  WBAT Disposition - Home Condition Upon Discharge - Good DVT Prophylaxis - Aspirin  and TED hose  Standley Earing, PA-C Orthopaedic Surgery 09/20/2023, 8:38 AM

## 2023-09-20 NOTE — Progress Notes (Signed)
 Physical Therapy Treatment Patient Details Name: Veronica Cisneros MRN: [id number] DOB: 06-Mar-1951 Today's Date: 09/20/2023   History of Present Illness Pt admitted for R TKR and is POD 0 at time of evaluation.    PT Comments  Pt is making good progress towards goals and has met all goals for dc from acute stay. RW used with reciprocal gait pattern. Reviewed and performed all there-ex. Stair training performed. Pain controlled.    If plan is discharge home, recommend the following: A little help with walking and/or transfers;A little help with bathing/dressing/bathroom;Assist for transportation;Help with stairs or ramp for entrance   Can travel by private vehicle        Equipment Recommendations  Rolling walker (2 wheels)    Recommendations for Other Services       Precautions / Restrictions Precautions Precautions: Fall;Knee Precaution Booklet Issued: Yes (comment) Recall of Precautions/Restrictions: Intact Restrictions Weight Bearing Restrictions Per Provider Order: Yes RLE Weight Bearing Per Provider Order: Weight bearing as tolerated     Mobility  Bed Mobility               General bed mobility comments: not performed, rreceived in recliner    Transfers Overall transfer level: Needs assistance Equipment used: Rolling walker (2 wheels) Transfers: Sit to/from Stand Sit to Stand: Supervision           General transfer comment: safe technique with upright posture    Ambulation/Gait Ambulation/Gait assistance: Supervision Gait Distance (Feet): 200 Feet Assistive device: Rolling walker (2 wheels) Gait Pattern/deviations: Step-through pattern       General Gait Details: ambulated in hallway with reciprocal gait pattern and safe technique. RW used with upright posture   Stairs Stairs: Yes Stairs assistance: Contact guard assist Stair Management: One rail Left, Step to pattern Number of Stairs: 4 General stair comments: up/down stairs with step to gait  pattern   Wheelchair Mobility     Tilt Bed    Modified Rankin (Stroke Patients Only)       Balance Overall balance assessment: Needs assistance Sitting-balance support: Feet supported Sitting balance-Leahy Scale: Good     Standing balance support: Bilateral upper extremity supported Standing balance-Leahy Scale: Fair                              Hotel manager: No apparent difficulties  Cognition Arousal: Alert Behavior During Therapy: WFL for tasks assessed/performed   PT - Cognitive impairments: No apparent impairments                       PT - Cognition Comments: motivated to participate Following commands: Intact      Cueing Cueing Techniques: Verbal cues  Exercises Total Joint Exercises Goniometric ROM: R knee AAROM: 0-70 degrees Other Exercises Other Exercises: pt ambulated to bathroom with cga. Able to perform self hygiene with supervision Other Exercises: reviewed and performed written HEP. Frequency and duration discussed.    General Comments        Pertinent Vitals/Pain Pain Assessment Pain Assessment: 0-10 Pain Score: 2  Pain Location: R knee Pain Descriptors / Indicators: Operative site guarding Pain Intervention(s): Limited activity within patient's tolerance, Repositioned, Ice applied    Home Living                          Prior Function            PT  Goals (current goals can now be found in the care plan section) Acute Rehab PT Goals Patient Stated Goal: to go home PT Goal Formulation: With patient Time For Goal Achievement: 10/03/23 Potential to Achieve Goals: Good Progress towards PT goals: Progressing toward goals    Frequency    BID      PT Plan      Co-evaluation              AM-PAC PT "6 Clicks" Mobility   Outcome Measure  Help needed turning from your back to your side while in a flat bed without using bedrails?: A Little Help needed moving  from lying on your back to sitting on the side of a flat bed without using bedrails?: A Little Help needed moving to and from a bed to a chair (including a wheelchair)?: A Little Help needed standing up from a chair using your arms (e.g., wheelchair or bedside chair)?: A Little Help needed to walk in hospital room?: A Little Help needed climbing 3-5 steps with a railing? : A Little 6 Click Score: 18    End of Session Equipment Utilized During Treatment: Gait belt Activity Tolerance: Patient tolerated treatment well Patient left: in chair;with family/visitor present Nurse Communication: Mobility status PT Visit Diagnosis: Muscle weakness (generalized) (M62.81);Difficulty in walking, not elsewhere classified (R26.2);Pain;Unsteadiness on feet (R26.81) Pain - Right/Left: Right Pain - part of body: Knee     Time: 2130-8657 PT Time Calculation (min) (ACUTE ONLY): 18 min  Charges:    $Gait Training: 8-22 mins PT General Charges $$ ACUTE PT VISIT: 1 Visit                     Amparo Balk, PT, DPT, GCS [phone]    [name] 09/20/2023, 11:01 AM
